# Patient Record
Sex: Male | Born: 1940 | Race: White | Hispanic: No | Smoking: Former smoker
Health system: Southern US, Community
[De-identification: ages and names within clinical notes are randomized; demographics above are authoritative.]

## PROBLEM LIST (undated history)

## (undated) DIAGNOSIS — I10 Essential (primary) hypertension: Secondary | ICD-10-CM

## (undated) HISTORY — PX: TREATMENT FISTULA ANAL: SUR1390

---

## 2003-09-05 DIAGNOSIS — I82409 Acute embolism and thrombosis of unspecified deep veins of unspecified lower extremity: Secondary | ICD-10-CM

## 2003-09-05 HISTORY — DX: Acute embolism and thrombosis of unspecified deep veins of unspecified lower extremity: I82.409

## 2005-10-02 ENCOUNTER — Ambulatory Visit: Payer: Self-pay | Admitting: Surgery

## 2006-03-01 ENCOUNTER — Inpatient Hospital Stay: Payer: Self-pay | Admitting: Internal Medicine

## 2006-03-01 ENCOUNTER — Other Ambulatory Visit: Payer: Self-pay

## 2006-10-02 ENCOUNTER — Ambulatory Visit: Payer: Self-pay | Admitting: Internal Medicine

## 2010-09-04 DIAGNOSIS — C439 Malignant melanoma of skin, unspecified: Secondary | ICD-10-CM

## 2010-09-04 HISTORY — DX: Malignant melanoma of skin, unspecified: C43.9

## 2011-10-02 ENCOUNTER — Ambulatory Visit: Payer: Self-pay | Admitting: Gastroenterology

## 2011-10-03 LAB — PATHOLOGY REPORT

## 2014-05-07 ENCOUNTER — Ambulatory Visit: Payer: Self-pay | Admitting: Internal Medicine

## 2014-10-23 DIAGNOSIS — Z7982 Long term (current) use of aspirin: Secondary | ICD-10-CM | POA: Diagnosis not present

## 2014-10-23 DIAGNOSIS — G43009 Migraine without aura, not intractable, without status migrainosus: Secondary | ICD-10-CM | POA: Diagnosis not present

## 2014-10-23 DIAGNOSIS — I809 Phlebitis and thrombophlebitis of unspecified site: Secondary | ICD-10-CM | POA: Diagnosis not present

## 2014-10-23 DIAGNOSIS — I10 Essential (primary) hypertension: Secondary | ICD-10-CM | POA: Diagnosis not present

## 2014-10-30 DIAGNOSIS — I10 Essential (primary) hypertension: Secondary | ICD-10-CM | POA: Diagnosis not present

## 2014-10-30 DIAGNOSIS — G43009 Migraine without aura, not intractable, without status migrainosus: Secondary | ICD-10-CM | POA: Diagnosis not present

## 2014-10-30 DIAGNOSIS — I809 Phlebitis and thrombophlebitis of unspecified site: Secondary | ICD-10-CM | POA: Diagnosis not present

## 2015-01-20 DIAGNOSIS — D229 Melanocytic nevi, unspecified: Secondary | ICD-10-CM | POA: Diagnosis not present

## 2015-01-20 DIAGNOSIS — Z85820 Personal history of malignant melanoma of skin: Secondary | ICD-10-CM | POA: Diagnosis not present

## 2015-01-20 DIAGNOSIS — L820 Inflamed seborrheic keratosis: Secondary | ICD-10-CM | POA: Diagnosis not present

## 2015-01-20 DIAGNOSIS — L821 Other seborrheic keratosis: Secondary | ICD-10-CM | POA: Diagnosis not present

## 2015-01-20 DIAGNOSIS — D1800 Hemangioma unspecified site: Secondary | ICD-10-CM | POA: Diagnosis not present

## 2015-01-20 DIAGNOSIS — L720 Epidermal cyst: Secondary | ICD-10-CM | POA: Diagnosis not present

## 2015-01-20 DIAGNOSIS — L570 Actinic keratosis: Secondary | ICD-10-CM | POA: Diagnosis not present

## 2015-01-20 DIAGNOSIS — Z1283 Encounter for screening for malignant neoplasm of skin: Secondary | ICD-10-CM | POA: Diagnosis not present

## 2015-04-23 DIAGNOSIS — I10 Essential (primary) hypertension: Secondary | ICD-10-CM | POA: Diagnosis not present

## 2015-04-23 DIAGNOSIS — I809 Phlebitis and thrombophlebitis of unspecified site: Secondary | ICD-10-CM | POA: Diagnosis not present

## 2015-04-30 DIAGNOSIS — I809 Phlebitis and thrombophlebitis of unspecified site: Secondary | ICD-10-CM | POA: Diagnosis not present

## 2015-04-30 DIAGNOSIS — Z125 Encounter for screening for malignant neoplasm of prostate: Secondary | ICD-10-CM | POA: Diagnosis not present

## 2015-04-30 DIAGNOSIS — Z7982 Long term (current) use of aspirin: Secondary | ICD-10-CM | POA: Diagnosis not present

## 2015-04-30 DIAGNOSIS — I10 Essential (primary) hypertension: Secondary | ICD-10-CM | POA: Diagnosis not present

## 2015-10-26 DIAGNOSIS — Z7982 Long term (current) use of aspirin: Secondary | ICD-10-CM | POA: Diagnosis not present

## 2015-10-26 DIAGNOSIS — I10 Essential (primary) hypertension: Secondary | ICD-10-CM | POA: Diagnosis not present

## 2015-10-26 DIAGNOSIS — Z125 Encounter for screening for malignant neoplasm of prostate: Secondary | ICD-10-CM | POA: Diagnosis not present

## 2015-11-02 DIAGNOSIS — I10 Essential (primary) hypertension: Secondary | ICD-10-CM | POA: Diagnosis not present

## 2015-11-02 DIAGNOSIS — S41111S Laceration without foreign body of right upper arm, sequela: Secondary | ICD-10-CM | POA: Diagnosis not present

## 2015-11-02 DIAGNOSIS — G43009 Migraine without aura, not intractable, without status migrainosus: Secondary | ICD-10-CM | POA: Diagnosis not present

## 2015-11-02 DIAGNOSIS — Z23 Encounter for immunization: Secondary | ICD-10-CM | POA: Diagnosis not present

## 2015-11-02 DIAGNOSIS — Z7982 Long term (current) use of aspirin: Secondary | ICD-10-CM | POA: Diagnosis not present

## 2015-11-02 DIAGNOSIS — Z0000 Encounter for general adult medical examination without abnormal findings: Secondary | ICD-10-CM | POA: Diagnosis not present

## 2016-01-19 DIAGNOSIS — L578 Other skin changes due to chronic exposure to nonionizing radiation: Secondary | ICD-10-CM | POA: Diagnosis not present

## 2016-01-19 DIAGNOSIS — Z85820 Personal history of malignant melanoma of skin: Secondary | ICD-10-CM | POA: Diagnosis not present

## 2016-01-19 DIAGNOSIS — D485 Neoplasm of uncertain behavior of skin: Secondary | ICD-10-CM | POA: Diagnosis not present

## 2016-01-19 DIAGNOSIS — L812 Freckles: Secondary | ICD-10-CM | POA: Diagnosis not present

## 2016-01-19 DIAGNOSIS — L821 Other seborrheic keratosis: Secondary | ICD-10-CM | POA: Diagnosis not present

## 2016-01-19 DIAGNOSIS — C4491 Basal cell carcinoma of skin, unspecified: Secondary | ICD-10-CM

## 2016-01-19 DIAGNOSIS — D229 Melanocytic nevi, unspecified: Secondary | ICD-10-CM | POA: Diagnosis not present

## 2016-01-19 DIAGNOSIS — L570 Actinic keratosis: Secondary | ICD-10-CM | POA: Diagnosis not present

## 2016-01-19 DIAGNOSIS — Z1283 Encounter for screening for malignant neoplasm of skin: Secondary | ICD-10-CM | POA: Diagnosis not present

## 2016-01-19 DIAGNOSIS — C44311 Basal cell carcinoma of skin of nose: Secondary | ICD-10-CM | POA: Diagnosis not present

## 2016-01-19 HISTORY — DX: Basal cell carcinoma of skin, unspecified: C44.91

## 2016-04-20 DIAGNOSIS — L578 Other skin changes due to chronic exposure to nonionizing radiation: Secondary | ICD-10-CM | POA: Diagnosis not present

## 2016-04-20 DIAGNOSIS — L570 Actinic keratosis: Secondary | ICD-10-CM | POA: Diagnosis not present

## 2016-04-20 DIAGNOSIS — D229 Melanocytic nevi, unspecified: Secondary | ICD-10-CM | POA: Diagnosis not present

## 2016-04-20 DIAGNOSIS — Z1283 Encounter for screening for malignant neoplasm of skin: Secondary | ICD-10-CM | POA: Diagnosis not present

## 2016-04-20 DIAGNOSIS — Z85820 Personal history of malignant melanoma of skin: Secondary | ICD-10-CM | POA: Diagnosis not present

## 2016-04-20 DIAGNOSIS — Z85828 Personal history of other malignant neoplasm of skin: Secondary | ICD-10-CM | POA: Diagnosis not present

## 2016-04-20 DIAGNOSIS — L821 Other seborrheic keratosis: Secondary | ICD-10-CM | POA: Diagnosis not present

## 2016-04-20 DIAGNOSIS — B078 Other viral warts: Secondary | ICD-10-CM | POA: Diagnosis not present

## 2016-04-20 DIAGNOSIS — D1800 Hemangioma unspecified site: Secondary | ICD-10-CM | POA: Diagnosis not present

## 2016-04-25 DIAGNOSIS — Z7982 Long term (current) use of aspirin: Secondary | ICD-10-CM | POA: Diagnosis not present

## 2016-04-25 DIAGNOSIS — I10 Essential (primary) hypertension: Secondary | ICD-10-CM | POA: Diagnosis not present

## 2016-05-02 DIAGNOSIS — G43009 Migraine without aura, not intractable, without status migrainosus: Secondary | ICD-10-CM | POA: Diagnosis not present

## 2016-05-02 DIAGNOSIS — Z7982 Long term (current) use of aspirin: Secondary | ICD-10-CM | POA: Diagnosis not present

## 2016-05-02 DIAGNOSIS — I10 Essential (primary) hypertension: Secondary | ICD-10-CM | POA: Diagnosis not present

## 2016-05-02 DIAGNOSIS — Z125 Encounter for screening for malignant neoplasm of prostate: Secondary | ICD-10-CM | POA: Diagnosis not present

## 2016-10-23 DIAGNOSIS — L812 Freckles: Secondary | ICD-10-CM | POA: Diagnosis not present

## 2016-10-23 DIAGNOSIS — D1800 Hemangioma unspecified site: Secondary | ICD-10-CM | POA: Diagnosis not present

## 2016-10-23 DIAGNOSIS — L820 Inflamed seborrheic keratosis: Secondary | ICD-10-CM | POA: Diagnosis not present

## 2016-10-23 DIAGNOSIS — L578 Other skin changes due to chronic exposure to nonionizing radiation: Secondary | ICD-10-CM | POA: Diagnosis not present

## 2016-10-23 DIAGNOSIS — Z85828 Personal history of other malignant neoplasm of skin: Secondary | ICD-10-CM | POA: Diagnosis not present

## 2016-10-23 DIAGNOSIS — D229 Melanocytic nevi, unspecified: Secondary | ICD-10-CM | POA: Diagnosis not present

## 2016-10-23 DIAGNOSIS — L570 Actinic keratosis: Secondary | ICD-10-CM | POA: Diagnosis not present

## 2016-10-23 DIAGNOSIS — Z1283 Encounter for screening for malignant neoplasm of skin: Secondary | ICD-10-CM | POA: Diagnosis not present

## 2016-10-23 DIAGNOSIS — Z85820 Personal history of malignant melanoma of skin: Secondary | ICD-10-CM | POA: Diagnosis not present

## 2016-10-27 DIAGNOSIS — Z125 Encounter for screening for malignant neoplasm of prostate: Secondary | ICD-10-CM | POA: Diagnosis not present

## 2016-10-27 DIAGNOSIS — I10 Essential (primary) hypertension: Secondary | ICD-10-CM | POA: Diagnosis not present

## 2016-10-27 DIAGNOSIS — Z7982 Long term (current) use of aspirin: Secondary | ICD-10-CM | POA: Diagnosis not present

## 2016-11-03 DIAGNOSIS — I10 Essential (primary) hypertension: Secondary | ICD-10-CM | POA: Diagnosis not present

## 2016-11-03 DIAGNOSIS — Z0000 Encounter for general adult medical examination without abnormal findings: Secondary | ICD-10-CM | POA: Diagnosis not present

## 2016-11-03 DIAGNOSIS — Z7982 Long term (current) use of aspirin: Secondary | ICD-10-CM | POA: Diagnosis not present

## 2016-11-03 DIAGNOSIS — Z23 Encounter for immunization: Secondary | ICD-10-CM | POA: Diagnosis not present

## 2017-04-23 DIAGNOSIS — Z85828 Personal history of other malignant neoplasm of skin: Secondary | ICD-10-CM | POA: Diagnosis not present

## 2017-04-23 DIAGNOSIS — L578 Other skin changes due to chronic exposure to nonionizing radiation: Secondary | ICD-10-CM | POA: Diagnosis not present

## 2017-04-23 DIAGNOSIS — L821 Other seborrheic keratosis: Secondary | ICD-10-CM | POA: Diagnosis not present

## 2017-04-23 DIAGNOSIS — L570 Actinic keratosis: Secondary | ICD-10-CM | POA: Diagnosis not present

## 2017-04-23 DIAGNOSIS — D1800 Hemangioma unspecified site: Secondary | ICD-10-CM | POA: Diagnosis not present

## 2017-04-23 DIAGNOSIS — D229 Melanocytic nevi, unspecified: Secondary | ICD-10-CM | POA: Diagnosis not present

## 2017-04-23 DIAGNOSIS — L812 Freckles: Secondary | ICD-10-CM | POA: Diagnosis not present

## 2017-04-23 DIAGNOSIS — Z85820 Personal history of malignant melanoma of skin: Secondary | ICD-10-CM | POA: Diagnosis not present

## 2017-05-01 DIAGNOSIS — Z7982 Long term (current) use of aspirin: Secondary | ICD-10-CM | POA: Diagnosis not present

## 2017-05-01 DIAGNOSIS — I10 Essential (primary) hypertension: Secondary | ICD-10-CM | POA: Diagnosis not present

## 2017-05-10 DIAGNOSIS — I10 Essential (primary) hypertension: Secondary | ICD-10-CM | POA: Diagnosis not present

## 2017-05-10 DIAGNOSIS — Z7982 Long term (current) use of aspirin: Secondary | ICD-10-CM | POA: Diagnosis not present

## 2017-05-10 DIAGNOSIS — Z125 Encounter for screening for malignant neoplasm of prostate: Secondary | ICD-10-CM | POA: Diagnosis not present

## 2017-05-10 DIAGNOSIS — G43909 Migraine, unspecified, not intractable, without status migrainosus: Secondary | ICD-10-CM | POA: Diagnosis not present

## 2017-10-24 DIAGNOSIS — L820 Inflamed seborrheic keratosis: Secondary | ICD-10-CM | POA: Diagnosis not present

## 2017-10-24 DIAGNOSIS — Z1283 Encounter for screening for malignant neoplasm of skin: Secondary | ICD-10-CM | POA: Diagnosis not present

## 2017-10-24 DIAGNOSIS — L570 Actinic keratosis: Secondary | ICD-10-CM | POA: Diagnosis not present

## 2017-10-24 DIAGNOSIS — D1800 Hemangioma unspecified site: Secondary | ICD-10-CM | POA: Diagnosis not present

## 2017-10-24 DIAGNOSIS — Z85828 Personal history of other malignant neoplasm of skin: Secondary | ICD-10-CM | POA: Diagnosis not present

## 2017-10-24 DIAGNOSIS — L812 Freckles: Secondary | ICD-10-CM | POA: Diagnosis not present

## 2017-10-24 DIAGNOSIS — D229 Melanocytic nevi, unspecified: Secondary | ICD-10-CM | POA: Diagnosis not present

## 2017-10-24 DIAGNOSIS — L578 Other skin changes due to chronic exposure to nonionizing radiation: Secondary | ICD-10-CM | POA: Diagnosis not present

## 2017-10-24 DIAGNOSIS — Z85820 Personal history of malignant melanoma of skin: Secondary | ICD-10-CM | POA: Diagnosis not present

## 2017-11-05 DIAGNOSIS — Z125 Encounter for screening for malignant neoplasm of prostate: Secondary | ICD-10-CM | POA: Diagnosis not present

## 2017-11-05 DIAGNOSIS — Z7982 Long term (current) use of aspirin: Secondary | ICD-10-CM | POA: Diagnosis not present

## 2017-11-05 DIAGNOSIS — I10 Essential (primary) hypertension: Secondary | ICD-10-CM | POA: Diagnosis not present

## 2017-11-12 DIAGNOSIS — G43909 Migraine, unspecified, not intractable, without status migrainosus: Secondary | ICD-10-CM | POA: Diagnosis not present

## 2017-11-12 DIAGNOSIS — I10 Essential (primary) hypertension: Secondary | ICD-10-CM | POA: Diagnosis not present

## 2017-11-12 DIAGNOSIS — R3129 Other microscopic hematuria: Secondary | ICD-10-CM | POA: Diagnosis not present

## 2017-11-12 DIAGNOSIS — Z0000 Encounter for general adult medical examination without abnormal findings: Secondary | ICD-10-CM | POA: Diagnosis not present

## 2018-04-24 DIAGNOSIS — Z85820 Personal history of malignant melanoma of skin: Secondary | ICD-10-CM | POA: Diagnosis not present

## 2018-04-24 DIAGNOSIS — L578 Other skin changes due to chronic exposure to nonionizing radiation: Secondary | ICD-10-CM | POA: Diagnosis not present

## 2018-04-24 DIAGNOSIS — L821 Other seborrheic keratosis: Secondary | ICD-10-CM | POA: Diagnosis not present

## 2018-04-24 DIAGNOSIS — D1801 Hemangioma of skin and subcutaneous tissue: Secondary | ICD-10-CM | POA: Diagnosis not present

## 2018-04-24 DIAGNOSIS — Z85828 Personal history of other malignant neoplasm of skin: Secondary | ICD-10-CM | POA: Diagnosis not present

## 2018-04-24 DIAGNOSIS — Z1283 Encounter for screening for malignant neoplasm of skin: Secondary | ICD-10-CM | POA: Diagnosis not present

## 2018-04-24 DIAGNOSIS — L812 Freckles: Secondary | ICD-10-CM | POA: Diagnosis not present

## 2018-04-25 ENCOUNTER — Encounter: Payer: Self-pay | Admitting: *Deleted

## 2018-05-09 DIAGNOSIS — R3129 Other microscopic hematuria: Secondary | ICD-10-CM | POA: Diagnosis not present

## 2018-05-09 DIAGNOSIS — I10 Essential (primary) hypertension: Secondary | ICD-10-CM | POA: Diagnosis not present

## 2018-05-14 DIAGNOSIS — L720 Epidermal cyst: Secondary | ICD-10-CM | POA: Diagnosis not present

## 2018-05-16 DIAGNOSIS — G43909 Migraine, unspecified, not intractable, without status migrainosus: Secondary | ICD-10-CM | POA: Diagnosis not present

## 2018-05-16 DIAGNOSIS — I10 Essential (primary) hypertension: Secondary | ICD-10-CM | POA: Diagnosis not present

## 2018-05-16 DIAGNOSIS — Z125 Encounter for screening for malignant neoplasm of prostate: Secondary | ICD-10-CM | POA: Diagnosis not present

## 2018-05-23 ENCOUNTER — Ambulatory Visit: Payer: Self-pay | Admitting: General Surgery

## 2018-10-28 ENCOUNTER — Other Ambulatory Visit: Payer: Self-pay | Admitting: Internal Medicine

## 2018-10-28 DIAGNOSIS — M5442 Lumbago with sciatica, left side: Secondary | ICD-10-CM

## 2018-10-29 ENCOUNTER — Ambulatory Visit
Admission: RE | Admit: 2018-10-29 | Discharge: 2018-10-29 | Disposition: A | Discharge status: Home / Self Care | Payer: Medicare Other | Source: Ambulatory Visit | Attending: Internal Medicine | Admitting: Internal Medicine

## 2018-10-29 DIAGNOSIS — M5442 Lumbago with sciatica, left side: Secondary | ICD-10-CM | POA: Diagnosis not present

## 2019-10-06 DIAGNOSIS — U071 COVID-19: Secondary | ICD-10-CM

## 2019-10-06 HISTORY — DX: COVID-19: U07.1

## 2020-11-01 ENCOUNTER — Ambulatory Visit: Payer: Medicare Other | Admitting: Dermatology

## 2020-11-01 ENCOUNTER — Other Ambulatory Visit: Payer: Self-pay

## 2020-11-01 DIAGNOSIS — Z85828 Personal history of other malignant neoplasm of skin: Secondary | ICD-10-CM

## 2020-11-01 DIAGNOSIS — L853 Xerosis cutis: Secondary | ICD-10-CM

## 2020-11-01 DIAGNOSIS — L814 Other melanin hyperpigmentation: Secondary | ICD-10-CM

## 2020-11-01 DIAGNOSIS — L821 Other seborrheic keratosis: Secondary | ICD-10-CM

## 2020-11-01 DIAGNOSIS — L570 Actinic keratosis: Secondary | ICD-10-CM

## 2020-11-01 DIAGNOSIS — L720 Epidermal cyst: Secondary | ICD-10-CM

## 2020-11-01 DIAGNOSIS — L578 Other skin changes due to chronic exposure to nonionizing radiation: Secondary | ICD-10-CM | POA: Diagnosis not present

## 2020-11-01 DIAGNOSIS — Z1283 Encounter for screening for malignant neoplasm of skin: Secondary | ICD-10-CM

## 2020-11-01 DIAGNOSIS — D692 Other nonthrombocytopenic purpura: Secondary | ICD-10-CM

## 2020-11-01 DIAGNOSIS — L820 Inflamed seborrheic keratosis: Secondary | ICD-10-CM | POA: Diagnosis not present

## 2020-11-01 DIAGNOSIS — D1800 Hemangioma unspecified site: Secondary | ICD-10-CM

## 2020-11-01 DIAGNOSIS — D229 Melanocytic nevi, unspecified: Secondary | ICD-10-CM

## 2020-11-01 NOTE — Patient Instructions (Addendum)
Melanoma ABCDEs  Melanoma is the most dangerous type of skin cancer, and is the leading cause of death from skin disease.  You are more likely to develop melanoma if you:  Have light-colored skin, light-colored eyes, or red or blond hair  Spend a lot of time in the sun  Tan regularly, either outdoors or in a tanning bed  Have had blistering sunburns, especially during childhood  Have a close family member who has had a melanoma  Have atypical moles or large birthmarks  Early detection of melanoma is key since treatment is typically straightforward and cure rates are extremely high if we catch it early.   The first sign of melanoma is often a change in a mole or a new dark spot.  The ABCDE system is a way of remembering the signs of melanoma.  A for asymmetry:  The two halves do not match. B for border:  The edges of the growth are irregular. C for color:  A mixture of colors are present instead of an even brown color. D for diameter:  Melanomas are usually (but not always) greater than 58mm - the size of a pencil eraser. E for evolution:  The spot keeps changing in size, shape, and color.  Please check your skin once per month between visits. You can use a small mirror in front and a large mirror behind you to keep an eye on the back side or your body.   If you see any new or changing lesions before your next follow-up, please call to schedule a visit.  Please continue daily skin protection including broad spectrum sunscreen SPF 30+ to sun-exposed areas, reapplying every 2 hours as needed when you're outdoors.    Cryotherapy Aftercare  . Wash gently with soap and water everyday.   Marland Kitchen Apply Vaseline and Band-Aid daily until healed.   Gentle Skin Care Guide  1. Bathe no more than once a day.  2. Avoid bathing in hot water  3. Use a mild soap like Dove, Vanicream, Cetaphil, CeraVe. Can use Lever 2000 or Cetaphil antibacterial soap  4. Use soap only where you need it. On most  days, use it under your arms, between your legs, and on your feet. Let the water rinse other areas unless visibly dirty.  5. When you get out of the bath/shower, use a towel to gently blot your skin dry, don't rub it.  6. While your skin is still a little damp, apply a moisturizing cream such as Vanicream, CeraVe, Cetaphil, Eucerin, Sarna lotion or plain Vaseline Jelly. For hands apply Neutrogena [place] Hand Cream or Excipial Hand Cream.  7. Reapply moisturizer any time you start to itch or feel dry.  8. Sometimes using free and clear laundry detergents can be helpful. Fabric softener sheets should be avoided. Downy Free & Gentle liquid, or any liquid fabric softener that is free of dyes and perfumes, it acceptable to use  9. If your doctor has given you prescription creams you may apply moisturizers over them

## 2020-11-01 NOTE — Progress Notes (Signed)
Follow-Up Visit   Subjective  Johnny Patterson is a 80 y.o. male who presents for the following: TBSE (Patient here for full body skin exam and skin cancer screening. 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 ). The patient presents for Total-Body Skin Exam (TBSE) for skin cancer screening and mole check.  The following portions of the chart were reviewed this encounter and updated as appropriate:   Allergies  Meds  Problems  Med Hx  Surg Hx  Fam Hx     Review of Systems:  No other skin or systemic complaints except as noted in HPI or Assessment and Plan.  Objective  Well appearing patient in no apparent distress; mood and affect are within normal limits.  A full examination was performed including scalp, head, eyes, ears, nose, lips, neck, chest, axillae, abdomen, back, buttocks, bilateral upper extremities, bilateral lower extremities, hands, feet, fingers, toes, fingernails, and toenails. All findings within normal limits unless otherwise noted below.  Objective  Right cheek x 1: Erythematous thin papules/macules with gritty scale.   Objective  Right Hand x 1, right forearm x 1: Erythematous keratotic or waxy stuck-on papule or plaque.   Objective  Right Upper Back: Subcutaneous nodule.    Assessment & Plan  AK (actinic keratosis) Right cheek x 1  Destruction of lesion - Right cheek x 1 Complexity: simple   Destruction method: cryotherapy   Informed consent: discussed and consent obtained   Timeout:  patient name, date of birth, surgical site, and procedure verified Lesion destroyed using liquid nitrogen: Yes   Region frozen until ice ball extended beyond lesion: Yes   Outcome: patient tolerated procedure well with no complications   Post-procedure details: wound care instructions given    Inflamed seborrheic keratosis Right Hand x 1, right forearm x 1  Destruction of lesion - Right Hand x 1, right forearm x 1 Complexity: simple   Destruction method:  cryotherapy   Informed consent: discussed and consent obtained   Timeout:  patient name, date of birth, surgical site, and procedure verified Lesion destroyed using liquid nitrogen: Yes   Region frozen until ice ball extended beyond lesion: Yes   Outcome: patient tolerated procedure well with no complications   Post-procedure details: wound care instructions given    Epidermal inclusion cyst Right Upper Back  Benign-appearing.  Observation.  Call clinic for new or changing lesions.  Recommend daily use of broad spectrum spf 30+ sunscreen to sun-exposed areas.     History of Basal Cell Carcinoma of the Skin - No evidence of recurrence today at right superior nasal ala - Recommend regular full body skin exams - Recommend daily broad spectrum sunscreen SPF 30+ to sun-exposed areas, reapply every 2 hours as needed.  - Call if any new or changing lesions are noted between office visits  Lentigines - Scattered tan macules - Due to sun exposure - Benign-appering, observe - Recommend daily broad spectrum sunscreen SPF 30+ to sun-exposed areas, reapply every 2 hours as needed. - Call for any changes  Seborrheic Keratoses - Stuck-on, waxy, tan-brown papules and plaques  - Discussed benign etiology and prognosis. - Observe - Call for any changes  Melanocytic Nevi - Tan-brown and/or pink-flesh-colored symmetric macules and papules - Benign appearing on exam today - Observation - Call clinic for new or changing moles - Recommend daily use of broad spectrum spf 30+ sunscreen to sun-exposed areas.   Hemangiomas - Red papules - Discussed benign nature - Observe - Call  for any changes  Actinic Damage - Chronic, secondary to cumulative UV/sun exposure - diffuse scaly erythematous macules with underlying dyspigmentation - Recommend daily broad spectrum sunscreen SPF 30+ to sun-exposed areas, reapply every 2 hours as needed.  - Call for new or changing lesions.  Skin cancer screening  performed today.  Purpura - Chronic; persistent and recurrent.  Treatable, but not curable. - Violaceous macules and patches - Benign - Related to trauma, age, sun damage and/or use of blood thinners, chronic use of topical and/or oral steroids - Observe - Can use OTC arnica containing moisturizer such as Dermend Bruise Formula if desired - Call for worsening or other concerns  Xerosis - diffuse xerotic patches - recommend gentle, hydrating skin care - gentle skin care handout given   Return in about 1 year (around 11/01/2021) for TBSE.  Graciella Belton, RMA, am acting as scribe for Sarina Ser, MD . Documentation: I have reviewed the above documentation for accuracy and completeness, and I agree with the above.  Sarina Ser, MD

## 2020-11-02 ENCOUNTER — Encounter: Payer: Self-pay | Admitting: Dermatology

## 2020-11-30 ENCOUNTER — Other Ambulatory Visit: Payer: Self-pay

## 2020-11-30 ENCOUNTER — Encounter: Payer: Self-pay | Admitting: Ophthalmology

## 2020-12-02 ENCOUNTER — Other Ambulatory Visit: Payer: Self-pay

## 2020-12-02 ENCOUNTER — Other Ambulatory Visit
Admission: RE | Admit: 2020-12-02 | Discharge: 2020-12-02 | Disposition: A | Discharge status: Home / Self Care | Payer: Medicare Other | Source: Ambulatory Visit | Attending: Ophthalmology | Admitting: Ophthalmology

## 2020-12-02 DIAGNOSIS — Z01812 Encounter for preprocedural laboratory examination: Secondary | ICD-10-CM | POA: Diagnosis present

## 2020-12-02 DIAGNOSIS — Z20822 Contact with and (suspected) exposure to covid-19: Secondary | ICD-10-CM | POA: Diagnosis not present

## 2020-12-02 LAB — SARS CORONAVIRUS 2 (TAT 6-24 HRS): SARS Coronavirus 2: NEGATIVE

## 2020-12-02 NOTE — Discharge Instructions (Signed)

## 2020-12-06 ENCOUNTER — Encounter
Admission: RE | Disposition: A | Discharge status: Home / Self Care | Payer: Self-pay | Source: Home / Self Care | Attending: Ophthalmology

## 2020-12-06 ENCOUNTER — Ambulatory Visit: Payer: Medicare Other | Admitting: Anesthesiology

## 2020-12-06 ENCOUNTER — Encounter: Payer: Self-pay | Admitting: Ophthalmology

## 2020-12-06 ENCOUNTER — Ambulatory Visit
Admission: RE | Admit: 2020-12-06 | Discharge: 2020-12-06 | Disposition: A | Discharge status: Home / Self Care | Payer: Medicare Other | Attending: Ophthalmology | Admitting: Ophthalmology

## 2020-12-06 ENCOUNTER — Other Ambulatory Visit: Payer: Self-pay

## 2020-12-06 DIAGNOSIS — Z79899 Other long term (current) drug therapy: Secondary | ICD-10-CM | POA: Diagnosis not present

## 2020-12-06 DIAGNOSIS — Z87891 Personal history of nicotine dependence: Secondary | ICD-10-CM | POA: Insufficient documentation

## 2020-12-06 DIAGNOSIS — I10 Essential (primary) hypertension: Secondary | ICD-10-CM | POA: Diagnosis not present

## 2020-12-06 DIAGNOSIS — Z7982 Long term (current) use of aspirin: Secondary | ICD-10-CM | POA: Insufficient documentation

## 2020-12-06 DIAGNOSIS — Z8616 Personal history of COVID-19: Secondary | ICD-10-CM | POA: Insufficient documentation

## 2020-12-06 DIAGNOSIS — H2512 Age-related nuclear cataract, left eye: Secondary | ICD-10-CM | POA: Insufficient documentation

## 2020-12-06 DIAGNOSIS — Z86718 Personal history of other venous thrombosis and embolism: Secondary | ICD-10-CM | POA: Insufficient documentation

## 2020-12-06 DIAGNOSIS — Z85828 Personal history of other malignant neoplasm of skin: Secondary | ICD-10-CM | POA: Insufficient documentation

## 2020-12-06 HISTORY — PX: CATARACT EXTRACTION W/PHACO: SHX586

## 2020-12-06 HISTORY — DX: Essential (primary) hypertension: I10

## 2020-12-06 SURGERY — PHACOEMULSIFICATION, CATARACT, WITH IOL INSERTION
Anesthesia: Monitor Anesthesia Care | Site: Eye | Laterality: Left

## 2020-12-06 MED ORDER — TETRACAINE HCL 0.5 % OP SOLN
1.0000 [drp] | OPHTHALMIC | Status: DC | PRN
  Administered 2020-12-06 (×3): 1 [drp] via OPHTHALMIC

## 2020-12-06 MED ORDER — EPINEPHRINE PF 1 MG/ML IJ SOLN
INTRAOCULAR | Status: DC | PRN
  Administered 2020-12-06: 28 mL via OPHTHALMIC

## 2020-12-06 MED ORDER — SODIUM HYALURONATE 23 MG/ML IO SOLN
INTRAOCULAR | Status: DC | PRN
  Administered 2020-12-06: 0.6 mL via INTRAOCULAR

## 2020-12-06 MED ORDER — LIDOCAINE HCL (PF) 2 % IJ SOLN
INTRAOCULAR | Status: DC | PRN
  Administered 2020-12-06: 1 mL via INTRAOCULAR

## 2020-12-06 MED ORDER — MIDAZOLAM HCL 2 MG/2ML IJ SOLN
INTRAMUSCULAR | Status: DC | PRN
  Administered 2020-12-06: 2 mg via INTRAVENOUS

## 2020-12-06 MED ORDER — MOXIFLOXACIN HCL 0.5 % OP SOLN
OPHTHALMIC | Status: DC | PRN
  Administered 2020-12-06: 0.2 mL via OPHTHALMIC

## 2020-12-06 MED ORDER — FENTANYL CITRATE (PF) 100 MCG/2ML IJ SOLN
INTRAMUSCULAR | Status: DC | PRN
  Administered 2020-12-06: 50 ug via INTRAVENOUS

## 2020-12-06 MED ORDER — SODIUM HYALURONATE 10 MG/ML IO SOLN
INTRAOCULAR | Status: DC | PRN
  Administered 2020-12-06: 0.55 mL via INTRAOCULAR

## 2020-12-06 MED ORDER — ARMC OPHTHALMIC DILATING DROPS
1.0000 "application " | OPHTHALMIC | Status: DC | PRN
  Administered 2020-12-06 (×3): 1 via OPHTHALMIC

## 2020-12-06 SURGICAL SUPPLY — 17 items
CANNULA ANT/CHMB 27GA (MISCELLANEOUS) ×4 IMPLANT
DISSECTOR HYDRO NUCLEUS 50X22 (MISCELLANEOUS) ×2 IMPLANT
GLOVE PI ULTRA LF STRL 7.5 (GLOVE) ×1 IMPLANT
GLOVE PI ULTRA NON LATEX 7.5 (GLOVE) ×1
GLOVE SURG SYN 8.5  E (GLOVE) ×1
GLOVE SURG SYN 8.5 E (GLOVE) ×1 IMPLANT
GOWN STRL REUS W/ TWL LRG LVL3 (GOWN DISPOSABLE) ×2 IMPLANT
GOWN STRL REUS W/TWL LRG LVL3 (GOWN DISPOSABLE) ×4
LENS IOL TECNIS EYHANCE 18.0 (Intraocular Lens) ×2 IMPLANT
MARKER SKIN DUAL TIP RULER LAB (MISCELLANEOUS) ×2 IMPLANT
PACK DR. KING ARMS (PACKS) ×2 IMPLANT
PACK EYE AFTER SURG (MISCELLANEOUS) ×2 IMPLANT
PACK OPTHALMIC (MISCELLANEOUS) ×2 IMPLANT
SYR 3ML LL SCALE MARK (SYRINGE) ×2 IMPLANT
SYR TB 1ML LUER SLIP (SYRINGE) ×2 IMPLANT
WATER STERILE IRR 250ML POUR (IV SOLUTION) ×2 IMPLANT
WIPE NON LINTING 3.25X3.25 (MISCELLANEOUS) ×2 IMPLANT

## 2020-12-06 NOTE — Anesthesia Postprocedure Evaluation (Signed)
Anesthesia Post Note  Patient: Johnny Patterson  Procedure(s) Performed: CATARACT EXTRACTION PHACO AND INTRAOCULAR LENS PLACEMENT (IOC) LEFT 11.70 01:00.3 (Left Eye)     Patient location during evaluation: PACU Anesthesia Type: MAC Level of consciousness: awake and alert Pain management: pain level controlled Vital Signs Assessment: post-procedure vital signs reviewed and stable Respiratory status: spontaneous breathing, nonlabored ventilation, respiratory function stable and patient connected to nasal cannula oxygen Cardiovascular status: stable and blood pressure returned to baseline Postop Assessment: no apparent nausea or vomiting Anesthetic complications: no   No complications documented.  Adele Barthel Cassandra Harbold

## 2020-12-06 NOTE — Op Note (Signed)
OPERATIVE NOTE  Johnny Patterson [id number] 12/06/2020   PREOPERATIVE DIAGNOSIS:  Nuclear sclerotic cataract left eye.  H25.12   POSTOPERATIVE DIAGNOSIS:    Nuclear sclerotic cataract left eye.     PROCEDURE:  Phacoemusification with posterior chamber intraocular lens placement of the left eye   LENS:   Implant Name Type Inv. Item Serial No. Manufacturer Lot No. LRB No. Used Action  LENS IOL TECNIS EYHANCE 18.0 - [id number] Intraocular Lens LENS IOL TECNIS EYHANCE 18.0 [id number] JOHNSON   Left 1 Implanted      Procedure(s): CATARACT EXTRACTION PHACO AND INTRAOCULAR LENS PLACEMENT (IOC) LEFT 11.70 01:00.3 (Left)  DIB00 +18.0   ULTRASOUND TIME: 1 minutes 0 seconds.  CDE 11.70   SURGEON:  Benay Pillow, MD, MPH   ANESTHESIA:  Topical with tetracaine drops augmented with 1% preservative-free intracameral lidocaine.  ESTIMATED BLOOD LOSS: <1 mL   COMPLICATIONS:  None.   DESCRIPTION OF PROCEDURE:  The patient was identified in the holding room and transported to the operating room and placed in the supine position under the operating microscope.  The left eye was identified as the operative eye and it was prepped and draped in the usual sterile ophthalmic fashion.   A 1.0 millimeter clear-corneal paracentesis was made at the 5:00 position. 0.5 ml of preservative-free 1% lidocaine with epinephrine was injected into the anterior chamber.  The anterior chamber was filled with Healon 5 viscoelastic.  A 2.4 millimeter keratome was used to make a near-clear corneal incision at the 2:00 position.  A curvilinear capsulorrhexis was made with a cystotome and capsulorrhexis forceps.  Balanced salt solution was used to hydrodissect and hydrodelineate the nucleus.   Phacoemulsification was then used in stop and chop fashion to remove the lens nucleus and epinucleus.  The remaining cortex was then removed using the irrigation and aspiration handpiece. Healon was then placed into the capsular bag to  distend it for lens placement.  A lens was then injected into the capsular bag.  The remaining viscoelastic was aspirated.   Wounds were hydrated with balanced salt solution.  The anterior chamber was inflated to a physiologic pressure with balanced salt solution.  Intracameral vigamox 0.1 mL undiltued was injected into the eye and a drop placed onto the ocular surface.  No wound leaks were noted.  The patient was taken to the recovery room in stable condition without complications of anesthesia or surgery  Benay Pillow 12/06/2020, 12:57 PM

## 2020-12-06 NOTE — Transfer of Care (Signed)
Immediate Anesthesia Transfer of Care Note  Patient: Johnny Patterson  Procedure(s) Performed: CATARACT EXTRACTION PHACO AND INTRAOCULAR LENS PLACEMENT (IOC) LEFT 11.70 01:00.3 (Left Eye)  Patient Location: PACU  Anesthesia Type: MAC  Level of Consciousness: awake, alert  and patient cooperative  Airway and Oxygen Therapy: Patient Spontanous Breathing and Patient connected to supplemental oxygen  Post-op Assessment: Post-op Vital signs reviewed, Patient's Cardiovascular Status Stable, Respiratory Function Stable, Patent Airway and No signs of Nausea or vomiting  Post-op Vital Signs: Reviewed and stable  Complications: No complications documented.

## 2020-12-06 NOTE — H&P (Signed)
[facility]   Primary Care Physician:  Adin Hector, MD Ophthalmologist: Dr. Benay Pillow  Pre-Procedure History & Physical: HPI:  Johnny Patterson is a 80 y.o. male here for cataract surgery.   Past Medical History:  Diagnosis Date  . Basal cell carcinoma 01/19/2016   R sup nasal ala   . COVID-19 10/2019  . DVT (deep venous thrombosis) ([place]) 2005   left.  Resolved.  . Hypertension     Past Surgical History:  Procedure Laterality Date  . TREATMENT FISTULA ANAL      Prior to Admission medications   Medication Sig Start Date End Date Taking? Authorizing Provider  aspirin 325 MG EC tablet Take by mouth.   Yes [provider]  lisinopril-hydrochlorothiazide (ZESTORETIC) 20-25 MG tablet Take 1 tablet by mouth daily. 12/01/19  Yes [provider]    Allergies as of 10/05/2020  . (Not on File)    History reviewed. No pertinent family history.  Social History   Socioeconomic History  . Marital status: Divorced    Spouse name: Not on file  . Number of children: Not on file  . Years of education: Not on file  . Highest education level: Not on file  Occupational History  . Not on file  Tobacco Use  . Smoking status: Former Smoker    Types: Cigarettes    Quit date: 1990    Years since quitting: 32.2  . Smokeless tobacco: Never Used  Vaping Use  . Vaping Use: Never used  Substance and Sexual Activity  . Alcohol use: Yes    Comment: rare  . Drug use: Not on file  . Sexual activity: Not on file  Other Topics Concern  . Not on file  Social History Narrative  . Not on file   Social Determinants of Health   Financial Resource Strain: Not on file  Food Insecurity: Not on file  Transportation Needs: Not on file  Physical Activity: Not on file  Stress: Not on file  Social Connections: Not on file  Intimate Partner Violence: Not on file    Review of Systems: See HPI, otherwise negative ROS  Physical Exam: Pulse 75   Temp 98.4 F  (36.9 C) (Temporal)   Ht 6' (1.829 m)   Wt 92.5 kg   SpO2 98%   BMI 27.67 kg/m  General:   Alert,  pleasant and cooperative in NAD Head:  Normocephalic and atraumatic. Respiratory:  Normal work of breathing. Cardiovascular:  RRR  Impression/Plan: Johnny Patterson is here for cataract surgery.  Risks, benefits, limitations, and alternatives regarding cataract surgery have been reviewed with the patient.  Questions have been answered.  All parties agreeable.   Benay Pillow, MD  12/06/2020, 12:25 PM

## 2020-12-06 NOTE — Anesthesia Preprocedure Evaluation (Signed)
Anesthesia Evaluation  Patient identified by MRN, date of birth, ID band Patient awake    History of Anesthesia Complications Negative for: history of anesthetic complications  Airway Mallampati: III  TM Distance: >3 FB Neck ROM: Full    Dental no notable dental hx.    Pulmonary former smoker,    Pulmonary exam normal        Cardiovascular Exercise Tolerance: Good hypertension, Pt. on medications Normal cardiovascular exam  Recent MD note mentions intermittent chest tightness with stress test ordered. In discussion with patient, he notes that he has had these symptoms for many many years and has had negative stress tests in the past. His only risk factor for CAD is HTN. He is able to walk up multiple flights of stairs and is regularly physically active.    Neuro/Psych negative neurological ROS     GI/Hepatic negative GI ROS, Neg liver ROS,   Endo/Other  negative endocrine ROS  Renal/GU negative Renal ROS     Musculoskeletal negative musculoskeletal ROS (+)   Abdominal   Peds  Hematology negative hematology ROS (+)   Anesthesia Other Findings   Reproductive/Obstetrics                             Anesthesia Physical Anesthesia Plan  ASA: II  Anesthesia Plan: MAC   Post-op Pain Management:    Induction: Intravenous  PONV Risk Score and Plan: 1 and Midazolam, TIVA and Treatment may vary due to age or medical condition  Airway Management Planned: Nasal Cannula and Natural Airway  Additional Equipment: None  Intra-op Plan:   Post-operative Plan:   Informed Consent: I have reviewed the patients History and Physical, chart, labs and discussed the procedure including the risks, benefits and alternatives for the proposed anesthesia with the patient or authorized representative who has indicated his/her understanding and acceptance.       Plan Discussed with: CRNA  Anesthesia Plan  Comments: (Discussed with patient and his son that ideally his stress test would have been completed prior to having elective surgery today, but based on his symptoms and history I believe that he is very low risk for complications related to anesthesia today and that it is safe to proceed with surgery. He and his son understand the risks and are in agreement. )        Anesthesia Quick Evaluation

## 2020-12-06 NOTE — Anesthesia Procedure Notes (Signed)
Procedure Name: MAC Date/Time: 12/06/2020 12:36 PM Performed by: Silvana Newness, CRNA Pre-anesthesia Checklist: Patient identified, Emergency Drugs available, Suction available, Patient being monitored and Timeout performed Patient Re-evaluated:Patient Re-evaluated prior to induction Oxygen Delivery Method: Nasal cannula Placement Confirmation: positive ETCO2

## 2020-12-07 ENCOUNTER — Encounter: Payer: Self-pay | Admitting: Ophthalmology

## 2020-12-09 ENCOUNTER — Other Ambulatory Visit: Payer: Self-pay

## 2020-12-09 ENCOUNTER — Encounter: Payer: Self-pay | Admitting: Ophthalmology

## 2020-12-16 NOTE — Discharge Instructions (Signed)

## 2020-12-20 ENCOUNTER — Encounter
Admission: RE | Disposition: A | Discharge status: Home / Self Care | Payer: Self-pay | Source: Home / Self Care | Attending: Ophthalmology

## 2020-12-20 ENCOUNTER — Ambulatory Visit
Admission: RE | Admit: 2020-12-20 | Discharge: 2020-12-20 | Disposition: A | Discharge status: Home / Self Care | Payer: Medicare Other | Attending: Ophthalmology | Admitting: Ophthalmology

## 2020-12-20 ENCOUNTER — Other Ambulatory Visit: Payer: Self-pay

## 2020-12-20 ENCOUNTER — Ambulatory Visit: Payer: Medicare Other | Admitting: Anesthesiology

## 2020-12-20 ENCOUNTER — Encounter: Payer: Self-pay | Admitting: Ophthalmology

## 2020-12-20 DIAGNOSIS — Z961 Presence of intraocular lens: Secondary | ICD-10-CM | POA: Diagnosis not present

## 2020-12-20 DIAGNOSIS — Z9842 Cataract extraction status, left eye: Secondary | ICD-10-CM | POA: Insufficient documentation

## 2020-12-20 DIAGNOSIS — Z86718 Personal history of other venous thrombosis and embolism: Secondary | ICD-10-CM | POA: Diagnosis not present

## 2020-12-20 DIAGNOSIS — Z7982 Long term (current) use of aspirin: Secondary | ICD-10-CM | POA: Insufficient documentation

## 2020-12-20 DIAGNOSIS — H2511 Age-related nuclear cataract, right eye: Secondary | ICD-10-CM | POA: Insufficient documentation

## 2020-12-20 DIAGNOSIS — Z8616 Personal history of COVID-19: Secondary | ICD-10-CM | POA: Insufficient documentation

## 2020-12-20 DIAGNOSIS — Z87891 Personal history of nicotine dependence: Secondary | ICD-10-CM | POA: Diagnosis not present

## 2020-12-20 DIAGNOSIS — I10 Essential (primary) hypertension: Secondary | ICD-10-CM | POA: Insufficient documentation

## 2020-12-20 DIAGNOSIS — Z794 Long term (current) use of insulin: Secondary | ICD-10-CM | POA: Insufficient documentation

## 2020-12-20 HISTORY — PX: CATARACT EXTRACTION W/PHACO: SHX586

## 2020-12-20 SURGERY — PHACOEMULSIFICATION, CATARACT, WITH IOL INSERTION
Anesthesia: Monitor Anesthesia Care | Site: Eye | Laterality: Right

## 2020-12-20 MED ORDER — FENTANYL CITRATE (PF) 100 MCG/2ML IJ SOLN
INTRAMUSCULAR | Status: DC | PRN
  Administered 2020-12-20: 50 ug via INTRAVENOUS

## 2020-12-20 MED ORDER — OXYCODONE HCL 5 MG PO TABS: 5.0000 mg | ORAL_TABLET | Freq: Once | ORAL | Status: DC | PRN

## 2020-12-20 MED ORDER — LACTATED RINGERS IV SOLN: INTRAVENOUS | Status: DC

## 2020-12-20 MED ORDER — MOXIFLOXACIN HCL 0.5 % OP SOLN
OPHTHALMIC | Status: DC | PRN
  Administered 2020-12-20: 0.2 mL via OPHTHALMIC

## 2020-12-20 MED ORDER — LIDOCAINE HCL (PF) 2 % IJ SOLN
INTRAOCULAR | Status: DC | PRN
  Administered 2020-12-20: 1 mL via INTRAOCULAR

## 2020-12-20 MED ORDER — MIDAZOLAM HCL 2 MG/2ML IJ SOLN
INTRAMUSCULAR | Status: DC | PRN
  Administered 2020-12-20: 2 mg via INTRAVENOUS

## 2020-12-20 MED ORDER — EPINEPHRINE PF 1 MG/ML IJ SOLN
INTRAOCULAR | Status: DC | PRN
  Administered 2020-12-20: 65 mL via OPHTHALMIC

## 2020-12-20 MED ORDER — ARMC OPHTHALMIC DILATING DROPS
1.0000 "application " | OPHTHALMIC | Status: DC | PRN
  Administered 2020-12-20 (×3): 1 via OPHTHALMIC

## 2020-12-20 MED ORDER — OXYCODONE HCL 5 MG/5ML PO SOLN: 5.0000 mg | Freq: Once | ORAL | Status: DC | PRN

## 2020-12-20 MED ORDER — TETRACAINE HCL 0.5 % OP SOLN
1.0000 [drp] | OPHTHALMIC | Status: DC | PRN
  Administered 2020-12-20 (×3): 1 [drp] via OPHTHALMIC

## 2020-12-20 MED ORDER — SODIUM HYALURONATE 10 MG/ML IO SOLN
INTRAOCULAR | Status: DC | PRN
  Administered 2020-12-20: 0.55 mL via INTRAOCULAR

## 2020-12-20 MED ORDER — SODIUM HYALURONATE 23 MG/ML IO SOLN
INTRAOCULAR | Status: DC | PRN
  Administered 2020-12-20: 0.6 mL via INTRAOCULAR

## 2020-12-20 SURGICAL SUPPLY — 17 items
CANNULA ANT/CHMB 27GA (MISCELLANEOUS) ×4 IMPLANT
DISSECTOR HYDRO NUCLEUS 50X22 (MISCELLANEOUS) ×2 IMPLANT
GLOVE PI ULTRA LF STRL 7.5 (GLOVE) ×1 IMPLANT
GLOVE PI ULTRA NON LATEX 7.5 (GLOVE) ×1
GLOVE SURG SYN 8.5  E (GLOVE) ×2
GLOVE SURG SYN 8.5 E (GLOVE) ×2 IMPLANT
GOWN STRL REUS W/ TWL LRG LVL3 (GOWN DISPOSABLE) ×2 IMPLANT
GOWN STRL REUS W/TWL LRG LVL3 (GOWN DISPOSABLE) ×4
LENS IOL TECNIS EYHANCE 17.5 (Intraocular Lens) ×2 IMPLANT
MARKER SKIN DUAL TIP RULER LAB (MISCELLANEOUS) ×2 IMPLANT
PACK DR. KING ARMS (PACKS) ×2 IMPLANT
PACK EYE AFTER SURG (MISCELLANEOUS) ×2 IMPLANT
PACK OPTHALMIC (MISCELLANEOUS) ×2 IMPLANT
SYR 3ML LL SCALE MARK (SYRINGE) ×2 IMPLANT
SYR TB 1ML LUER SLIP (SYRINGE) ×2 IMPLANT
WATER STERILE IRR 250ML POUR (IV SOLUTION) ×2 IMPLANT
WIPE NON LINTING 3.25X3.25 (MISCELLANEOUS) ×2 IMPLANT

## 2020-12-20 NOTE — Op Note (Signed)
OPERATIVE NOTE  Johnny Patterson [id number] 12/20/2020   PREOPERATIVE DIAGNOSIS:  Nuclear sclerotic cataract right eye.  H25.11   POSTOPERATIVE DIAGNOSIS:    Nuclear sclerotic cataract right eye.     PROCEDURE:  Phacoemusification with posterior chamber intraocular lens placement of the right eye   LENS:   Implant Name Type Inv. Item Serial No. Manufacturer Lot No. LRB No. Used Action  LENS IOL TECNIS EYHANCE 17.5 - [id number] Intraocular Lens LENS IOL TECNIS EYHANCE 17.5 [id number] JOHNSON   Right 1 Implanted       Procedure(s) with comments: CATARACT EXTRACTION PHACO AND INTRAOCULAR LENS PLACEMENT (IOC) RIGHT (Right) - 6.60 0:38.9  DIB00 +17.5   ULTRASOUND TIME: 0 minutes 38 seconds.  CDE 6.60   SURGEON:  Benay Pillow, MD, MPH  ANESTHESIOLOGIST: Anesthesiologist: Fidel Levy, MD CRNA: Cameron Ali, CRNA   ANESTHESIA:  Topical with tetracaine drops augmented with 1% preservative-free intracameral lidocaine.  ESTIMATED BLOOD LOSS: less than 1 mL.   COMPLICATIONS:  None.   DESCRIPTION OF PROCEDURE:  The patient was identified in the holding room and transported to the operating room and placed in the supine position under the operating microscope.  The right eye was identified as the operative eye and it was prepped and draped in the usual sterile ophthalmic fashion.   A 1.0 millimeter clear-corneal paracentesis was made at the 10:30 position. 0.5 ml of preservative-free 1% lidocaine with epinephrine was injected into the anterior chamber.  The anterior chamber was filled with Healon 5 viscoelastic.  A 2.4 millimeter keratome was used to make a near-clear corneal incision at the 8:00 position.  A curvilinear capsulorrhexis was made with a cystotome and capsulorrhexis forceps.  Balanced salt solution was used to hydrodissect and hydrodelineate the nucleus.   Phacoemulsification was then used in stop and chop fashion to remove the lens nucleus and epinucleus.  The remaining  cortex was then removed using the irrigation and aspiration handpiece. Healon was then placed into the capsular bag to distend it for lens placement.  A lens was then injected into the capsular bag.  The remaining viscoelastic was aspirated.   Wounds were hydrated with balanced salt solution.  The anterior chamber was inflated to a physiologic pressure with balanced salt solution.   Intracameral vigamox 0.1 mL undiluted was injected into the eye and a drop placed onto the ocular surface.  No wound leaks were noted.  The patient was taken to the recovery room in stable condition without complications of anesthesia or surgery  Benay Pillow 12/20/2020, 12:46 PM

## 2020-12-20 NOTE — Anesthesia Procedure Notes (Signed)
Procedure Name: MAC Date/Time: 12/20/2020 12:28 PM Performed by: Cameron Ali, CRNA Pre-anesthesia Checklist: Patient identified, Emergency Drugs available, Suction available, Timeout performed and Patient being monitored Patient Re-evaluated:Patient Re-evaluated prior to induction Oxygen Delivery Method: Nasal cannula Placement Confirmation: positive ETCO2

## 2020-12-20 NOTE — Transfer of Care (Signed)
Immediate Anesthesia Transfer of Care Note  Patient: Johnny Patterson  Procedure(s) Performed: CATARACT EXTRACTION PHACO AND INTRAOCULAR LENS PLACEMENT (IOC) RIGHT (Right Eye)  Patient Location: PACU  Anesthesia Type: MAC  Level of Consciousness: awake, alert  and patient cooperative  Airway and Oxygen Therapy: Patient Spontanous Breathing and Patient connected to supplemental oxygen  Post-op Assessment: Post-op Vital signs reviewed, Patient's Cardiovascular Status Stable, Respiratory Function Stable, Patent Airway and No signs of Nausea or vomiting  Post-op Vital Signs: Reviewed and stable  Complications: No complications documented.

## 2020-12-20 NOTE — Anesthesia Postprocedure Evaluation (Signed)
Anesthesia Post Note  Patient: Johnny Patterson  Procedure(s) Performed: CATARACT EXTRACTION PHACO AND INTRAOCULAR LENS PLACEMENT (IOC) RIGHT (Right Eye)     Patient location during evaluation: PACU Anesthesia Type: MAC Level of consciousness: awake and alert Pain management: pain level controlled Vital Signs Assessment: post-procedure vital signs reviewed and stable Respiratory status: spontaneous breathing, nonlabored ventilation, respiratory function stable and patient connected to nasal cannula oxygen Cardiovascular status: stable and blood pressure returned to baseline Postop Assessment: no apparent nausea or vomiting Anesthetic complications: no   No complications documented.  Fidel Levy

## 2020-12-20 NOTE — Anesthesia Preprocedure Evaluation (Signed)
Anesthesia Evaluation  Patient identified by MRN, date of birth, ID band Patient awake    Reviewed: NPO status   History of Anesthesia Complications Negative for: history of anesthetic complications  Airway Mallampati: II  TM Distance: >3 FB Neck ROM: full    Dental  (+) Missing   Pulmonary neg pulmonary ROS, former smoker,    Pulmonary exam normal        Cardiovascular Exercise Tolerance: Good hypertension, (-) angina+ DVT (2005)  Normal cardiovascular exam     Neuro/Psych negative neurological ROS  negative psych ROS   GI/Hepatic negative GI ROS, Neg liver ROS,   Endo/Other  Morbid obesity (bmi 28)  Renal/GU negative Renal ROS  negative genitourinary   Musculoskeletal   Abdominal   Peds  Hematology negative hematology ROS (+)   Anesthesia Other Findings pcp: Cheryll Cockayne, MD at 11/23/2020 ;  12/15/2020  1. Negative ETT, though specificity limited by RBBB 2. Normal left ventricular function 3. Normal wall motion 4. No evidence for scar or ischemia   had ECCE on 12/06/2020  COVID+ 10/2019   Reproductive/Obstetrics                             Anesthesia Physical Anesthesia Plan  ASA: II  Anesthesia Plan: MAC   Post-op Pain Management:    Induction:   PONV Risk Score and Plan: 1 and Midazolam  Airway Management Planned:   Additional Equipment:   Intra-op Plan:   Post-operative Plan:   Informed Consent: I have reviewed the patients History and Physical, chart, labs and discussed the procedure including the risks, benefits and alternatives for the proposed anesthesia with the patient or authorized representative who has indicated his/her understanding and acceptance.       Plan Discussed with: CRNA  Anesthesia Plan Comments:         Anesthesia Quick Evaluation

## 2020-12-20 NOTE — H&P (Signed)
[facility]   Primary Care Physician:  Adin Hector, MD Ophthalmologist: Dr. Benay Pillow  Pre-Procedure History & Physical: HPI:  Johnny Patterson is a 80 y.o. male here for cataract surgery.   Past Medical History:  Diagnosis Date  . Basal cell carcinoma 01/19/2016   R sup nasal ala   . COVID-19 10/2019  . DVT (deep venous thrombosis) ([place]) 2005   left.  Resolved.  . Hypertension     Past Surgical History:  Procedure Laterality Date  . CATARACT EXTRACTION W/PHACO Left 12/06/2020   Procedure: CATARACT EXTRACTION PHACO AND INTRAOCULAR LENS PLACEMENT (IOC) LEFT 11.70 01:00.3;  Surgeon: Eulogio Bear, MD;  Location: [place];  Service: Ophthalmology;  Laterality: Left;  . TREATMENT FISTULA ANAL      Prior to Admission medications   Medication Sig Start Date End Date Taking? Authorizing Provider  lisinopril-hydrochlorothiazide (ZESTORETIC) 20-25 MG tablet Take 1 tablet by mouth daily. 12/01/19  Yes [provider]  aspirin 325 MG EC tablet Take by mouth.    [provider]    Allergies as of 10/05/2020  . (Not on File)    History reviewed. No pertinent family history.  Social History   Socioeconomic History  . Marital status: Divorced    Spouse name: Not on file  . Number of children: Not on file  . Years of education: Not on file  . Highest education level: Not on file  Occupational History  . Not on file  Tobacco Use  . Smoking status: Former Smoker    Types: Cigarettes    Quit date: 1990    Years since quitting: 32.3  . Smokeless tobacco: Never Used  Vaping Use  . Vaping Use: Never used  Substance and Sexual Activity  . Alcohol use: Yes    Comment: rare  . Drug use: Not on file  . Sexual activity: Not on file  Other Topics Concern  . Not on file  Social History Narrative  . Not on file   Social Determinants of Health   Financial Resource Strain: Not on file  Food Insecurity: Not on file  Transportation  Needs: Not on file  Physical Activity: Not on file  Stress: Not on file  Social Connections: Not on file  Intimate Partner Violence: Not on file    Review of Systems: See HPI, otherwise negative ROS  Physical Exam: BP 140/70   Pulse 72   Temp 98.2 F (36.8 C) (Temporal)   Resp 18   Ht 6' (1.829 m)   Wt 92.5 kg   SpO2 100%   BMI 27.67 kg/m  General:   Alert,  pleasant and cooperative in NAD Head:  Normocephalic and atraumatic. Respiratory:  Normal work of breathing. Cardiovascular:  RRR  Impression/Plan: Johnny Patterson is here for cataract surgery.  Risks, benefits, limitations, and alternatives regarding cataract surgery have been reviewed with the patient.  Questions have been answered.  All parties agreeable.   Benay Pillow, MD  12/20/2020, 12:22 PM

## 2020-12-21 ENCOUNTER — Encounter: Payer: Self-pay | Admitting: Ophthalmology

## 2021-09-29 ENCOUNTER — Other Ambulatory Visit: Payer: Self-pay | Admitting: Internal Medicine

## 2021-09-29 DIAGNOSIS — M519 Unspecified thoracic, thoracolumbar and lumbosacral intervertebral disc disorder: Secondary | ICD-10-CM

## 2021-09-29 DIAGNOSIS — M5416 Radiculopathy, lumbar region: Secondary | ICD-10-CM

## 2021-10-08 ENCOUNTER — Ambulatory Visit
Admission: RE | Admit: 2021-10-08 | Discharge: 2021-10-08 | Disposition: A | Discharge status: Home / Self Care | Payer: Medicare Other | Source: Ambulatory Visit | Attending: Internal Medicine | Admitting: Internal Medicine

## 2021-10-08 ENCOUNTER — Other Ambulatory Visit: Payer: Self-pay

## 2021-10-08 DIAGNOSIS — M519 Unspecified thoracic, thoracolumbar and lumbosacral intervertebral disc disorder: Secondary | ICD-10-CM | POA: Insufficient documentation

## 2021-10-08 DIAGNOSIS — M5416 Radiculopathy, lumbar region: Secondary | ICD-10-CM | POA: Insufficient documentation

## 2021-11-02 ENCOUNTER — Ambulatory Visit: Payer: Medicare Other | Admitting: Dermatology

## 2021-11-02 ENCOUNTER — Encounter: Payer: Self-pay | Admitting: Dermatology

## 2021-11-02 ENCOUNTER — Other Ambulatory Visit: Payer: Self-pay

## 2021-11-02 DIAGNOSIS — D229 Melanocytic nevi, unspecified: Secondary | ICD-10-CM

## 2021-11-02 DIAGNOSIS — D1800 Hemangioma unspecified site: Secondary | ICD-10-CM

## 2021-11-02 DIAGNOSIS — L820 Inflamed seborrheic keratosis: Secondary | ICD-10-CM | POA: Diagnosis not present

## 2021-11-02 DIAGNOSIS — L821 Other seborrheic keratosis: Secondary | ICD-10-CM

## 2021-11-02 DIAGNOSIS — Z85828 Personal history of other malignant neoplasm of skin: Secondary | ICD-10-CM

## 2021-11-02 DIAGNOSIS — Z1283 Encounter for screening for malignant neoplasm of skin: Secondary | ICD-10-CM

## 2021-11-02 DIAGNOSIS — Z85820 Personal history of malignant melanoma of skin: Secondary | ICD-10-CM

## 2021-11-02 DIAGNOSIS — L578 Other skin changes due to chronic exposure to nonionizing radiation: Secondary | ICD-10-CM

## 2021-11-02 DIAGNOSIS — L814 Other melanin hyperpigmentation: Secondary | ICD-10-CM

## 2021-11-02 DIAGNOSIS — L570 Actinic keratosis: Secondary | ICD-10-CM

## 2021-11-02 NOTE — Patient Instructions (Signed)

## 2021-11-02 NOTE — Progress Notes (Signed)
? ?Follow-Up Visit ?  ?Subjective  ?Johnny Patterson is a 81 y.o. 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 The patient presents for Total-Body Skin Exam (TBSE) for skin cancer screening and mole check.  The patient has spots, moles and lesions to be evaluated, some may be new or changing. ? ?The following portions of the chart were reviewed this encounter and updated as appropriate:  ? Tobacco  Allergies  Meds  Problems  Med Hx  Surg Hx  Fam Hx   ?  ?Review of Systems:  No other skin or systemic complaints except as noted in HPI or Assessment and Plan. ? ?Objective  ?Well appearing patient in no apparent distress; mood and affect are within normal limits. ? ?A full examination was performed including scalp, head, eyes, ears, nose, lips, neck, chest, axillae, abdomen, back, buttocks, bilateral upper extremities, bilateral lower extremities, hands, feet, fingers, toes, fingernails, and toenails. All findings within normal limits unless otherwise noted below. ? ?R temple x 1, L cheek x 1 (2) ?Erythematous thin papules/macules with gritty scale.  ? ?B/L arms x 20 (20) ?Erythematous stuck-on, waxy papule or plaque ? ? ?Assessment & Plan  ?AK (actinic keratosis) (2) ?R temple x 1, L cheek x 1 ? ?Destruction of lesion - R temple x 1, L cheek x 1 ?Complexity: simple   ?Destruction method: cryotherapy   ?Informed consent: discussed and consent obtained   ?Timeout:  patient name, date of birth, surgical site, and procedure verified ?Lesion destroyed using liquid nitrogen: Yes   ?Region frozen until ice ball extended beyond lesion: Yes   ?Outcome: patient tolerated procedure well with no complications   ?Post-procedure details: wound care instructions given   ? ?Inflamed seborrheic keratosis (20) ?B/L arms x 20 ? ?Destruction of lesion - B/L arms x 20 ?Complexity: simple   ?Destruction method: cryotherapy   ?Informed consent: discussed and consent obtained    ?Timeout:  patient name, date of birth, surgical site, and procedure verified ?Lesion destroyed using liquid nitrogen: Yes   ?Region frozen until ice ball extended beyond lesion: Yes   ?Outcome: patient tolerated procedure well with no complications   ?Post-procedure details: wound care instructions given   ? ?Skin cancer screening ? ?Lentigines ?- Scattered tan macules ?- Due to sun exposure ?- Benign-appearing, observe ?- Recommend daily broad spectrum sunscreen SPF 30+ to sun-exposed areas, reapply every 2 hours as needed. ?- Call for any changes ? ?Seborrheic Keratoses ?- Stuck-on, waxy, tan-brown papules and/or plaques  ?- Benign-appearing ?- Discussed benign etiology and prognosis. ?- Observe ?- Call for any changes ? ?Melanocytic Nevi ?- Tan-brown and/or pink-flesh-colored symmetric macules and papules ?- Benign appearing on exam today ?- Observation ?- Call clinic for new or changing moles ?- Recommend daily use of broad spectrum spf 30+ sunscreen to sun-exposed areas.  ? ?Hemangiomas ?- Red papules ?- Discussed benign nature ?- Observe ?- Call for any changes ? 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  ?- Staying in the shade or wearing long sleeves, sun glasses (UVA+UVB protection) and wide brim hats (4-inch brim around the entire circumference of the hat) are also recommended for sun protection.  ?- Call for new or changing lesions. ? ?History of Basal Cell Carcinoma of the  Skin ?- No evidence of recurrence today ?- Recommend regular full body skin exams ?- Recommend daily broad spectrum sunscreen SPF 30+ to sun-exposed areas, reapply every 2 hours as needed.  ?- Call if any new or changing lesions are noted between office visits ? ?History of Melanoma - R temple  ?- No evidence of recurrence  today ?- No lymphadenopathy ?- Recommend regular full body skin exams ?- Recommend daily broad spectrum sunscreen SPF 30+ to sun-exposed areas, reapply every 2 hours as needed.  ?- Call if any new or changing lesions are noted between office visits ? ?Skin cancer screening performed today. ? ?Return in about 1 year (around 11/03/2022) for TBSE. ? ?I, Rudell Cobb, CMA, am acting as scribe for Sarina Ser, MD . ?Documentation: I have reviewed the above documentation for accuracy and completeness, and I agree with the above. ? ?Sarina Ser, MD ? ?

## 2021-11-04 ENCOUNTER — Encounter: Payer: Self-pay | Admitting: Dermatology

## 2022-07-04 IMAGING — MR MR LUMBAR SPINE W/O CM
5 series · 30 of 48 positions shown · non-contrast
Comparison: 10/29/2018

CLINICAL DATA: Chronic low back pain, recent lifting injury,
radiates into left leg

EXAM:
MRI LUMBAR SPINE WITHOUT CONTRAST
TECHNIQUE: Multiplanar, multisequence MR imaging of the lumbar spine was
performed. No intravenous contrast was administered.

[Series 5: T2 · sagittal · 4.0mm · 0.81mm/px · 6 of 17 slices shown (1 of 2)]
[im 1/17]
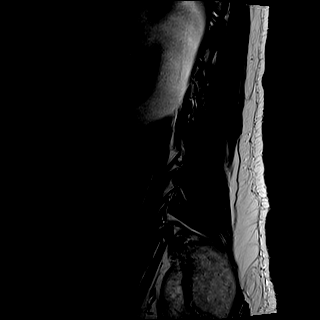
[im 4/17]
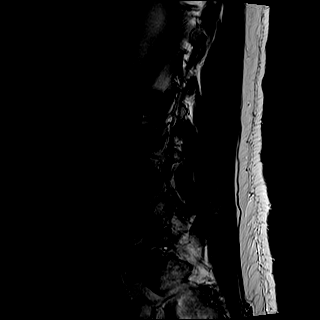
[im 7/17]
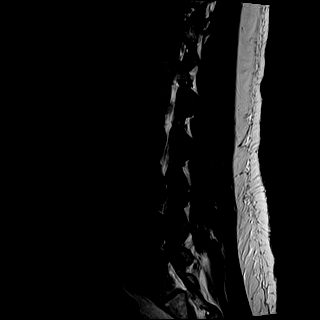
[im 10/17]
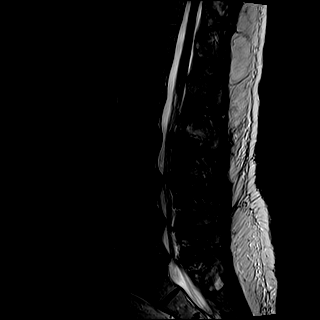
[im 13/17]
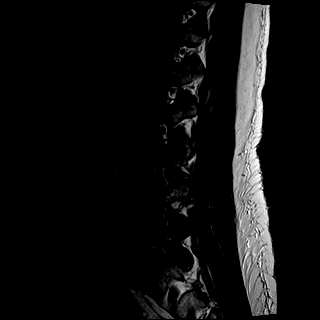
[im 17/17]
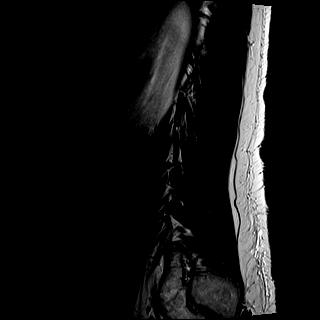

[Series 6: T1 · sagittal · 4.0mm · 0.81mm/px · 7 of 17 slices shown (1 of 2)]
[im 1/17]
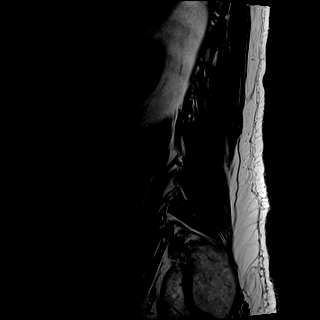
[im 3/17]
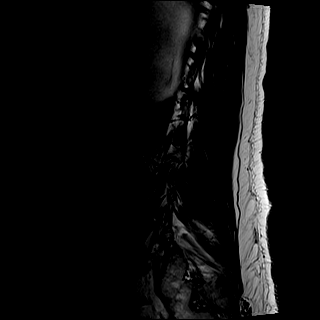
[im 6/17]
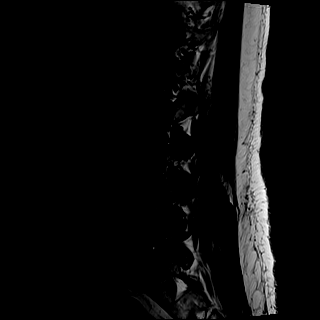
[im 9/17]
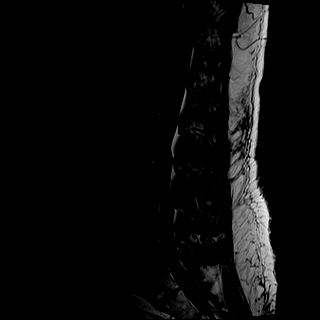
[im 11/17]
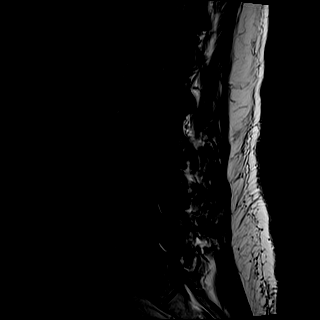
[im 14/17]
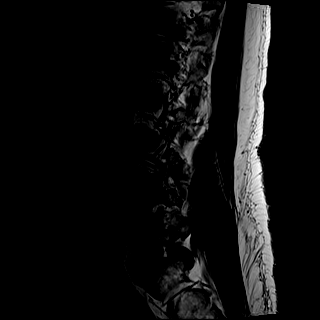
[im 17/17]
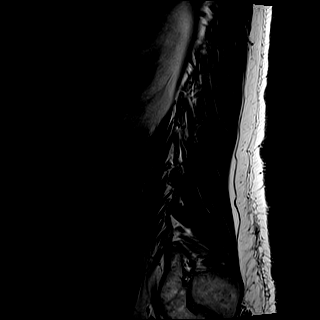

[Series 7: STIR · sagittal · 4.0mm · 0.41mm/px · 1 of 17 slices shown]
[im 1/17]
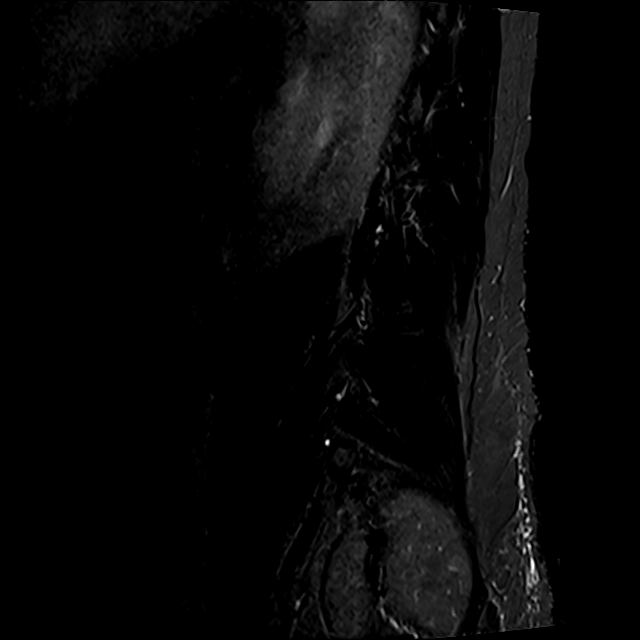

[Series 8: T2 · axial · 4.0mm · 0.78mm/px · z∈[-104,+116]mm · 8 of 36 slices shown (2 of 2)]
[im 1/36]
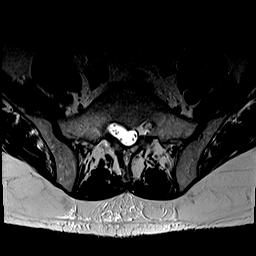
[im 6/36]
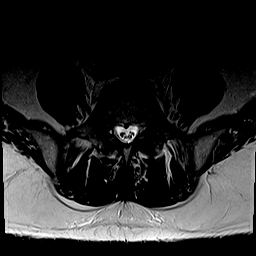
[im 11/36]
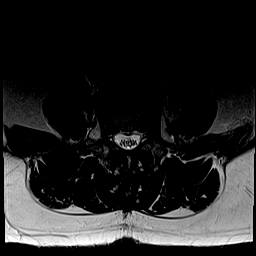
[im 17/36]
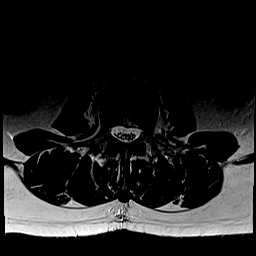
[im 19/36]
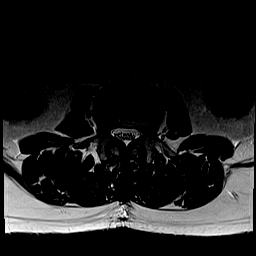
[im 25/36]
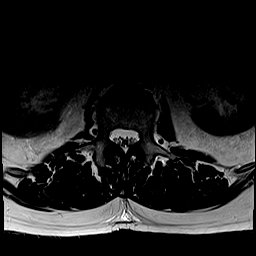
[im 30/36]
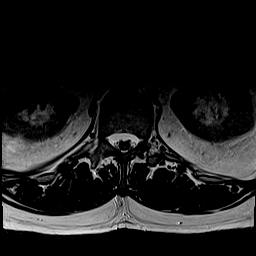
[im 36/36]
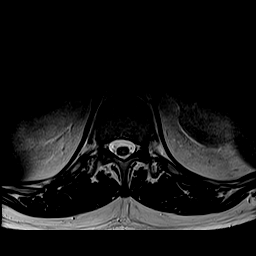

[Series 9: T1 · axial · 4.0mm · 0.39mm/px · z∈[-104,+116]mm · 8 of 36 slices shown (2 of 2)]
[im 1/36]
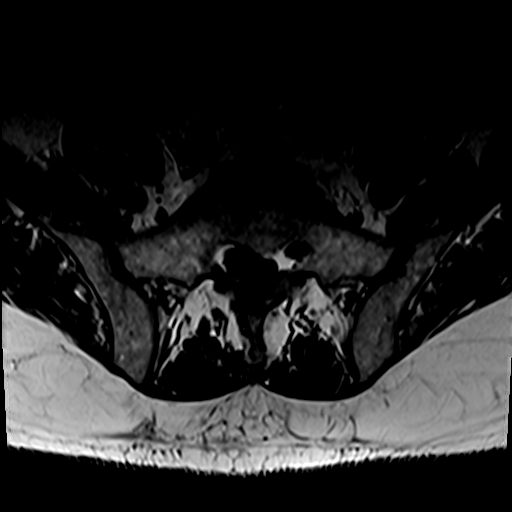
[im 6/36]
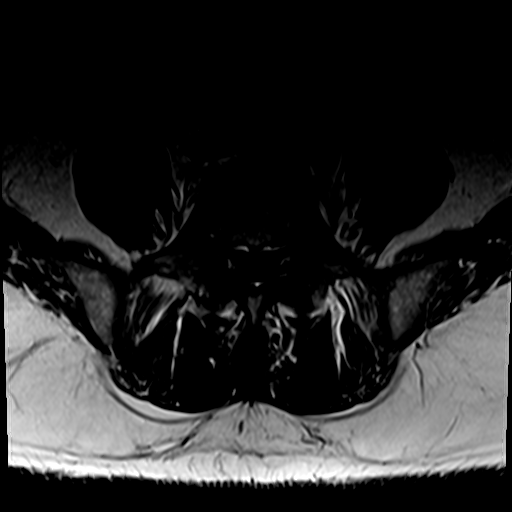
[im 11/36]
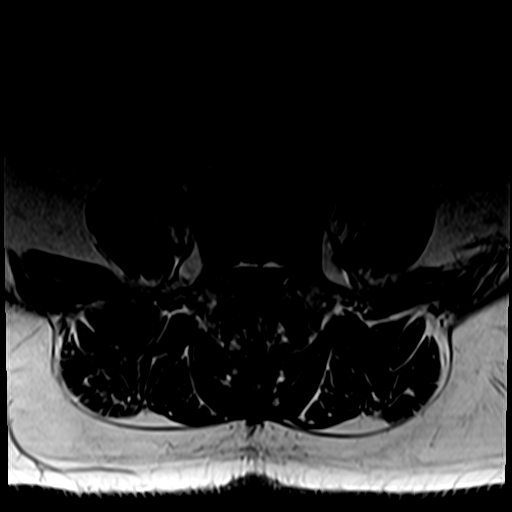
[im 17/36]
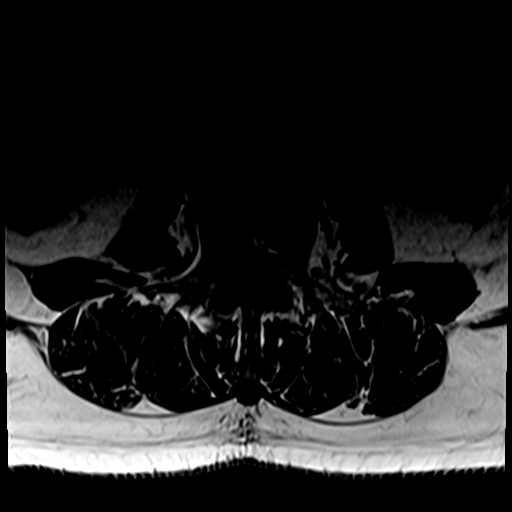
[im 19/36]
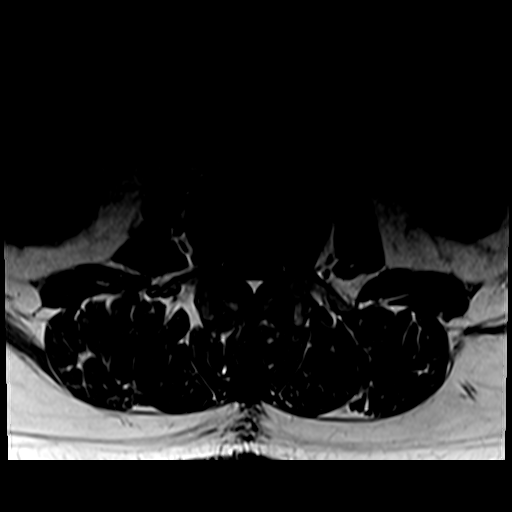
[im 25/36]
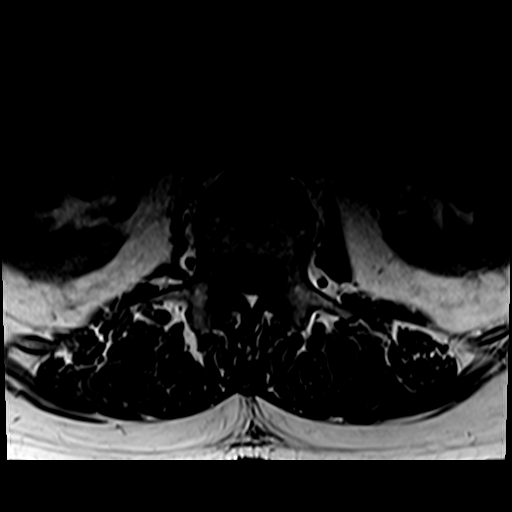
[im 30/36]
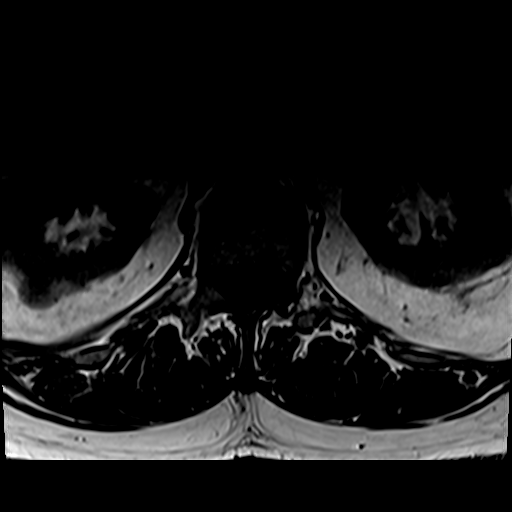
[im 36/36]
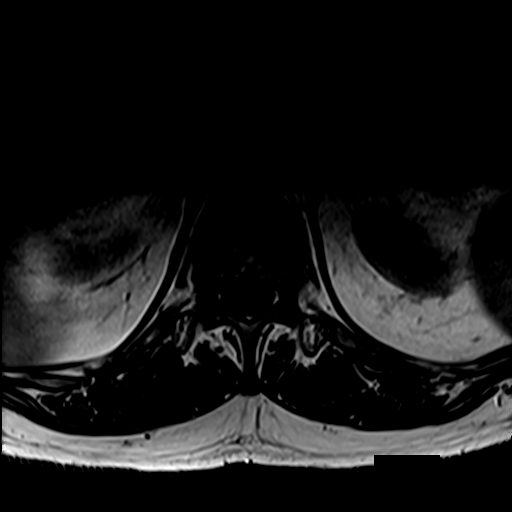

[30 of 48 positions shown; findings below may reference images not displayed]

FINDINGS: Segmentation:  Standard.

Alignment:  Trace levocurvature.  No significant listhesis.

Vertebrae:  No acute fracture or suspicious osseous lesion.

Conus medullaris and cauda equina: Conus extends to the L2 level.
Conus and cauda equina appear normal.

Paraspinal and other soft tissues: Renal cysts.

Disc levels:

T12-L1: No significant disc bulge. No spinal canal stenosis or
neural foraminal narrowing.

L1-L2: No significant disc bulge. No spinal canal stenosis or neural
foraminal narrowing.

L2-L3: Mild disc bulge. Mild facet arthropathy. No spinal canal
stenosis. Narrowing of the lateral recesses. Mild left neural
foraminal narrowing, which is new from the prior exam.

L3-L4: Moderate disc bulge. Moderate facet arthropathy, with
decreased size of previously noted anteriorly directed left facet
synovial cyst (series 8, image 23). Moderate spinal canal stenosis,
overall unchanged, with effacement of the left lateral recess and
narrowing of the right lateral recess. Moderate left and mild right
neural foraminal narrowing unchanged. The exiting left L3 nerve
appears to contact the foraminal portion of the disc bulge.

L4-L5: Mild disc bulge. Mild facet arthropathy. Narrowing of the
lateral recesses. Mild spinal canal stenosis, unchanged. Mild right
neural foraminal narrowing, unchanged.

L5-S1: Broad-based disc bulge with central protrusion with right
paracentral annular fissure, overall slightly increased compared to
the prior. Mild facet arthropathy. Effacement of the left lateral
recess. Narrowing of the right lateral recess. Mild spinal canal
stenosis, which is new from the prior exam. Mild-to-moderate
bilateral neural foraminal narrowing, likely unchanged.
IMPRESSION: 1. L5-S1 mild spinal canal stenosis, which is new from the prior
exam, and mild-to-moderate bilateral neural foraminal narrowing.
Effacement of the left lateral recess, which has increased from the
prior, at this level likely compresses the descending left S1 nerve
roots. Narrowing of the right lateral recess could also affect the
descending right S1 nerve roots.
2. L3-L4 moderate spinal canal stenosis, with moderate left and mild
right neural foraminal narrowing. The exiting left L3 nerve root
appears to contact foraminal portion of the disc bulge. Effacement
of the left lateral recess at this level likely compresses the
descending left L4 nerve roots. Narrowing of the right lateral
recess could affect the descending right L4 nerve roots.
3. L4-L5 mild spinal canal stenosis and mild right neural foraminal
narrowing. Narrowing of the lateral recesses at this level could
affect the descending L5 nerve roots.
4. L2-L3 mild left neural foraminal narrowing, which is new from the
prior exam. Narrowing of the lateral recesses at this level could
affect the descending L3 nerve roots.

## 2022-11-06 ENCOUNTER — Ambulatory Visit: Payer: Medicare Other | Admitting: Dermatology

## 2022-11-06 VITALS — BP 115/63 | HR 81

## 2022-11-06 DIAGNOSIS — Z85828 Personal history of other malignant neoplasm of skin: Secondary | ICD-10-CM

## 2022-11-06 DIAGNOSIS — Z1283 Encounter for screening for malignant neoplasm of skin: Secondary | ICD-10-CM

## 2022-11-06 DIAGNOSIS — D229 Melanocytic nevi, unspecified: Secondary | ICD-10-CM

## 2022-11-06 DIAGNOSIS — Z85820 Personal history of malignant melanoma of skin: Secondary | ICD-10-CM

## 2022-11-06 DIAGNOSIS — L578 Other skin changes due to chronic exposure to nonionizing radiation: Secondary | ICD-10-CM

## 2022-11-06 DIAGNOSIS — L814 Other melanin hyperpigmentation: Secondary | ICD-10-CM

## 2022-11-06 DIAGNOSIS — D225 Melanocytic nevi of trunk: Secondary | ICD-10-CM

## 2022-11-06 DIAGNOSIS — L821 Other seborrheic keratosis: Secondary | ICD-10-CM

## 2022-11-06 NOTE — Patient Instructions (Signed)
Due to recent changes in healthcare laws, you may see results of your pathology and/or laboratory studies on MyChart before the doctors have had a chance to review them. We understand that in some cases there may be results that are confusing or concerning to you. Please understand that not all results are received at the same time and often the doctors may need to interpret multiple results in order to provide you with the best plan of care or course of treatment. Therefore, we ask that you please give us 2 business days to thoroughly review all your results before contacting the office for clarification. Should we see a critical lab result, you will be contacted sooner.   If You Need Anything After Your Visit  If you have any questions or concerns for your doctor, please call our main line at [phone] and press option 4 to reach your doctor's medical assistant. If no one answers, please leave a voicemail as directed and we will return your call as soon as possible. Messages left after 4 pm will be answered the following business day.   You may also send us a message via MyChart. We typically respond to MyChart messages within 1-2 business days.  For prescription refills, please ask your pharmacy to contact our office. Our fax number is [phone].  If you have an urgent issue when the clinic is closed that cannot wait until the next business day, you can page your doctor at the number below.    Please note that while we do our best to be available for urgent issues outside of office hours, we are not available 24/7.   If you have an urgent issue and are unable to reach us, you may choose to seek medical care at your doctor's office, retail clinic, urgent care center, or emergency room.  If you have a medical emergency, please immediately call 911 or go to the emergency department.  Pager Numbers  - Dr. Kowalski: [phone]  - Dr. Moye: [phone]  - Dr. Stewart:  [phone]  In the event of inclement weather, please call our main line at [phone] for an update on the status of any delays or closures.  Dermatology Medication Tips: Please keep the boxes that topical medications come in in order to help keep track of the instructions about where and how to use these. Pharmacies typically print the medication instructions only on the boxes and not directly on the medication tubes.   If your medication is too expensive, please contact our office at [phone] option 4 or send us a message through MyChart.   We are unable to tell what your co-pay for medications will be in advance as this is different depending on your insurance coverage. However, we may be able to find a substitute medication at lower cost or fill out paperwork to get insurance to cover a needed medication.   If a prior authorization is required to get your medication covered by your insurance company, please allow us 1-2 business days to complete this process.  Drug prices often vary depending on where the prescription is filled and some pharmacies may offer cheaper prices.  The website www.goodrx.com contains coupons for medications through different pharmacies. The prices here do not account for what the cost may be with help from insurance (it may be cheaper with your insurance), but the website can give you the price if you did not use any insurance.  - You can print the associated coupon and take it with   your prescription to the pharmacy.  - You may also stop by our office during regular business hours and pick up a GoodRx coupon card.  - If you need your prescription sent electronically to a different pharmacy, notify our office through [place] MyChart or by phone at [phone] option 4.     Si Usted Necesita Algo Despus de Su Visita  Tambin puede enviarnos un mensaje a travs de MyChart. Por lo general respondemos a los mensajes de MyChart en el transcurso de 1 a 2  das hbiles.  Para renovar recetas, por favor pida a su farmacia que se ponga en contacto con nuestra oficina. Nuestro nmero de fax es el [phone].  Si tiene un asunto urgente cuando la clnica est cerrada y que no puede esperar hasta el siguiente da hbil, puede llamar/localizar a su doctor(a) al nmero que aparece a continuacin.   Por favor, tenga en cuenta que aunque hacemos todo lo posible para estar disponibles para asuntos urgentes fuera del horario de oficina, no estamos disponibles las 24 horas del da, los 7 das de la semana.   Si tiene un problema urgente y no puede comunicarse con nosotros, puede optar por buscar atencin mdica  en el consultorio de su doctor(a), en una clnica privada, en un centro de atencin urgente o en una sala de emergencias.  Si tiene una emergencia mdica, por favor llame inmediatamente al 911 o vaya a la sala de emergencias.  Nmeros de bper  - Dr. Kowalski: [phone]  - Dra. Moye: [phone]  - Dra. Stewart: [phone]  En caso de inclemencias del tiempo, por favor llame a nuestra lnea principal al [phone] para una actualizacin sobre el estado de cualquier retraso o cierre.  Consejos para la medicacin en dermatologa: Por favor, guarde las cajas en las que vienen los medicamentos de uso tpico para ayudarle a seguir las instrucciones sobre dnde y cmo usarlos. Las farmacias generalmente imprimen las instrucciones del medicamento slo en las cajas y no directamente en los tubos del medicamento.   Si su medicamento es muy caro, por favor, pngase en contacto con nuestra oficina llamando al [phone] y presione la opcin 4 o envenos un mensaje a travs de MyChart.   No podemos decirle cul ser su copago por los medicamentos por adelantado ya que esto es diferente dependiendo de la cobertura de su seguro. Sin embargo, es posible que podamos encontrar un medicamento sustituto a menor costo o llenar un formulario para que el  seguro cubra el medicamento que se considera necesario.   Si se requiere una autorizacin previa para que su compaa de seguros cubra su medicamento, por favor permtanos de 1 a 2 das hbiles para completar este proceso.  Los precios de los medicamentos varan con frecuencia dependiendo del lugar de dnde se surte la receta y alguna farmacias pueden ofrecer precios ms baratos.  El sitio web www.goodrx.com tiene cupones para medicamentos de diferentes farmacias. Los precios aqu no tienen en cuenta lo que podra costar con la ayuda del seguro (puede ser ms barato con su seguro), pero el sitio web puede darle el precio si no utiliz ningn seguro.  - Puede imprimir el cupn correspondiente y llevarlo con su receta a la farmacia.  - Tambin puede pasar por nuestra oficina durante el horario de atencin regular y recoger una tarjeta de cupones de GoodRx.  - Si necesita que su receta se enve electrnicamente a una farmacia diferente, informe a nuestra oficina a travs de MyChart de    o por telfono llamando al [phone] y presione la opcin 4.  

## 2022-11-06 NOTE — Progress Notes (Signed)
Follow-Up Visit   Subjective  Johnny Patterson is a 82 y.o. male who presents for the following: Annual Exam. Hx of Melanoma, hx of BCC The patient presents for Total-Body Skin Exam (TBSE) for skin cancer screening and mole check.  The patient has spots, moles and lesions to be evaluated, some may be new or changing and the patient has concerns that these could be cancer.   The following portions of the chart were reviewed this encounter and updated as appropriate:   Tobacco  Allergies  Meds  Problems  Med Hx  Surg Hx  Fam Hx     Review of Systems:  No other skin or systemic complaints except as noted in HPI or Assessment and Plan.  Objective  Well appearing patient in no apparent distress; mood and affect are within normal limits.  A full examination was performed including scalp, head, eyes, ears, nose, lips, neck, chest, axillae, abdomen, back, buttocks, bilateral upper extremities, bilateral lower extremities, hands, feet, fingers, toes, fingernails, and toenails. All findings within normal limits unless otherwise noted below.  chest - Tan-brown and/or pink-flesh-colored symmetric macules and papules   Assessment & Plan  Nevus chest - Benign appearing on exam today - Observation - Call clinic for new or changing moles - Recommend daily use of broad spectrum spf 30+ sunscreen to sun-exposed areas.    Lentigines - Scattered tan macules - Due to sun exposure - Benign-appearing, observe - Recommend daily broad spectrum sunscreen SPF 30+ to sun-exposed areas, reapply every 2 hours as needed. - Call for any changes  Seborrheic Keratoses - Stuck-on, waxy, tan-brown papules and/or plaques  - Benign-appearing - Discussed benign etiology and prognosis. - Observe - Call for any changes  Melanocytic Nevi - Tan-brown and/or pink-flesh-colored symmetric macules and papules - Benign appearing on exam today - Observation - Call clinic for new or changing moles -  Recommend daily use of broad spectrum spf 30+ sunscreen to sun-exposed areas.   Hemangiomas - Red papules - Discussed benign nature - Observe - Call for any changes  Actinic Damage - Chronic condition, secondary to cumulative UV/sun exposure - diffuse scaly erythematous macules with underlying dyspigmentation - Recommend daily broad spectrum sunscreen SPF 30+ to sun-exposed areas, reapply every 2 hours as needed.  - Staying in the shade or wearing long sleeves, sun glasses (UVA+UVB protection) and wide brim hats (4-inch brim around the entire circumference of the hat) are also recommended for sun protection.  - Call for new or changing lesions.  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  - Call if any new or changing lesions are noted between office visits   History of Melanoma Right temple  - No evidence of recurrence today - No lymphadenopathy - Recommend regular full body skin exams - Recommend daily broad spectrum sunscreen SPF 30+ to sun-exposed areas, reapply every 2 hours as needed.  - Call if any new or changing lesions are noted between office visits   Skin cancer screening performed today.   Return in about 1 year (around 11/06/2023) for TBSE, hx of Melanoma, hx of BCC.  IMarye Round, CMA, am acting as scribe for Sarina Ser, MD .  Documentation: I have reviewed the above documentation for accuracy and completeness, and I agree with the above.  Sarina Ser, MD

## 2022-11-11 ENCOUNTER — Encounter: Payer: Self-pay | Admitting: Dermatology

## 2023-11-07 ENCOUNTER — Ambulatory Visit: Payer: Medicare Other | Admitting: Dermatology

## 2024-01-07 ENCOUNTER — Encounter: Payer: Self-pay | Admitting: Dermatology

## 2024-01-07 ENCOUNTER — Ambulatory Visit: Admitting: Dermatology

## 2024-01-07 DIAGNOSIS — L570 Actinic keratosis: Secondary | ICD-10-CM | POA: Diagnosis not present

## 2024-01-07 DIAGNOSIS — Z85820 Personal history of malignant melanoma of skin: Secondary | ICD-10-CM

## 2024-01-07 DIAGNOSIS — C499 Malignant neoplasm of connective and soft tissue, unspecified: Secondary | ICD-10-CM

## 2024-01-07 DIAGNOSIS — D492 Neoplasm of unspecified behavior of bone, soft tissue, and skin: Secondary | ICD-10-CM | POA: Diagnosis not present

## 2024-01-07 DIAGNOSIS — D489 Neoplasm of uncertain behavior, unspecified: Secondary | ICD-10-CM

## 2024-01-07 DIAGNOSIS — D229 Melanocytic nevi, unspecified: Secondary | ICD-10-CM

## 2024-01-07 DIAGNOSIS — C4492 Squamous cell carcinoma of skin, unspecified: Secondary | ICD-10-CM

## 2024-01-07 DIAGNOSIS — D0461 Carcinoma in situ of skin of right upper limb, including shoulder: Secondary | ICD-10-CM | POA: Diagnosis not present

## 2024-01-07 DIAGNOSIS — D1801 Hemangioma of skin and subcutaneous tissue: Secondary | ICD-10-CM

## 2024-01-07 DIAGNOSIS — Z85828 Personal history of other malignant neoplasm of skin: Secondary | ICD-10-CM

## 2024-01-07 DIAGNOSIS — Z1283 Encounter for screening for malignant neoplasm of skin: Secondary | ICD-10-CM

## 2024-01-07 DIAGNOSIS — W908XXA Exposure to other nonionizing radiation, initial encounter: Secondary | ICD-10-CM | POA: Diagnosis not present

## 2024-01-07 DIAGNOSIS — C4911 Malignant neoplasm of connective and soft tissue of right upper limb, including shoulder: Secondary | ICD-10-CM

## 2024-01-07 DIAGNOSIS — L821 Other seborrheic keratosis: Secondary | ICD-10-CM

## 2024-01-07 DIAGNOSIS — L578 Other skin changes due to chronic exposure to nonionizing radiation: Secondary | ICD-10-CM

## 2024-01-07 DIAGNOSIS — L814 Other melanin hyperpigmentation: Secondary | ICD-10-CM

## 2024-01-07 HISTORY — DX: Squamous cell carcinoma of skin, unspecified: C44.92

## 2024-01-07 NOTE — Progress Notes (Signed)
 Follow-Up Visit   Subjective  Johnny Patterson is a 83 y.o. male who presents for the following: Skin Cancer Screening and Full Body Skin Exam Hx of melanoma Hx of ask Spot at right hand he is concerned about that keeps coming back   The patient presents for Total-Body Skin Exam (TBSE) for skin cancer screening and mole check. The patient has spots, moles and lesions to be evaluated, some may be new or changing and the patient may have concern these could be cancer.  The following portions of the chart were reviewed this encounter and updated as appropriate: medications, allergies, medical history  Review of Systems:  No other skin or systemic complaints except as noted in HPI or Assessment and Plan.  Objective  Well appearing patient in no apparent distress; mood and affect are within normal limits.  A full examination was performed including scalp, head, eyes, ears, nose, lips, neck, chest, axillae, abdomen, back, buttocks, bilateral upper extremities, bilateral lower extremities, hands, feet, fingers, toes, fingernails, and toenails. All findings within normal limits unless otherwise noted below.   Relevant physical exam findings are noted in the Assessment and Plan.  face x 6 (6) Erythematous thin papules/macules with gritty scale.  right dorsal hand proximal to little finger 2.1 cm Linear hyperkeratotic papule    Assessment & Plan   SKIN CANCER SCREENING PERFORMED TODAY.  ACTINIC DAMAGE - Chronic condition, secondary to cumulative UV/sun exposure - diffuse scaly erythematous macules with underlying dyspigmentation - Recommend daily broad spectrum sunscreen SPF 30+ to sun-exposed areas, reapply every 2 hours as needed.  - Staying in the shade or wearing long sleeves, sun glasses (UVA+UVB protection) and wide brim hats (4-inch brim around the entire circumference of the hat) are also recommended for sun protection.  - Call for new or changing lesions.  LENTIGINES,  SEBORRHEIC KERATOSES, HEMANGIOMAS - Benign normal skin lesions - Benign-appearing - Call for any changes  MELANOCYTIC NEVI - Tan-brown and/or pink-flesh-colored symmetric macules and papules - Benign appearing on exam today - Observation - Call clinic for new or changing moles - Recommend daily use of broad spectrum spf 30+ sunscreen to sun-exposed areas.    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  - Call if any new or changing lesions are noted between office visits    History of Melanoma Right temple  - No evidence of recurrence today - No lymphadenopathy - Recommend regular full body skin exams - Recommend daily broad spectrum sunscreen SPF 30+ to sun-exposed areas, reapply every 2 hours as needed.  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 We are now waiting for final margins. If positive, will recommend additional resection. If margins are negative, will plan on STSG once his tendon granulates.   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 They are chronic  with expected duration over 1 year. A portion of actinic keratoses will progress to squamous cell carcinoma of the skin. It is not possible to reliably predict which spots will progress to skin  cancer and so treatment is recommended to prevent development of skin cancer.  Recommend daily broad spectrum sunscreen SPF 30+ to sun-exposed areas, reapply every 2 hours as needed.  Recommend staying in the shade or wearing long sleeves, sun glasses (UVA+UVB protection) and wide brim hats (4-inch brim around the entire circumference of the hat). Call for new or changing lesions. Destruction of lesion - face x 6 (6) Complexity: simple   Destruction method: cryotherapy   Informed consent: discussed and consent obtained   Timeout:  patient name, date of birth, surgical site, and procedure verified Lesion destroyed using liquid nitrogen: Yes   Region frozen until ice ball extended beyond lesion: Yes   Outcome: patient tolerated procedure well with no complications   Post-procedure details: wound care instructions given   NEOPLASM OF UNCERTAIN BEHAVIOR right dorsal hand proximal to little finger Epidermal / dermal shaving  Lesion diameter (cm):  2.1 Informed consent: discussed and consent obtained   Timeout: patient name, date of birth, surgical site, and procedure verified   Procedure prep:  Patient was prepped and draped in usual sterile fashion Prep type:  Isopropyl alcohol Anesthesia: the lesion was anesthetized in a standard fashion   Anesthetic:  1% lidocaine  w/ epinephrine  1-100,000 buffered w/ 8.4% NaHCO3 Instrument used: flexible razor blade   Hemostasis achieved with: pressure, aluminum chloride and electrodesiccation   Outcome: patient tolerated procedure well   Post-procedure details: sterile dressing applied and wound care instructions given   Dressing type: bandage and petrolatum    Destruction of lesion Complexity: extensive   Destruction method: electrodesiccation and curettage   Informed consent: discussed and consent obtained   Timeout:  patient name, date of birth, surgical site, and procedure verified Procedure prep:  Patient was prepped and draped in usual  sterile fashion Prep type:  Isopropyl alcohol Anesthesia: the lesion was anesthetized in a standard fashion   Anesthetic:  1% lidocaine  w/ epinephrine  1-100,000 buffered w/ 8.4% NaHCO3 Curettage performed in three different directions: Yes   Electrodesiccation performed over the curetted area: Yes   Lesion length (cm):  2.1 Lesion width (cm):  2.1 Margin per side (cm):  0.2 Final wound size (cm):  2.5 Hemostasis achieved with:  pressure, aluminum chloride and electrodesiccation Outcome: patient tolerated procedure well with no complications   Post-procedure details: sterile dressing applied and wound care instructions given   Dressing type: bandage and petrolatum   Specimen 1 - Surgical pathology Differential Diagnosis: r/o scc ED&C today   Check Margins: No R/o SCC ED&C done today  SKIN CANCER SCREENING   ACTINIC SKIN DAMAGE   LENTIGO   MELANOCYTIC NEVUS, UNSPECIFIED LOCATION   HISTORY OF BASAL CELL CARCINOMA   HISTORY OF MALIGNANT MELANOMA   SARCOMA (HCC)   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  IRandee Busing, CMA, am acting as scribe for Celine Collard, MD.   Documentation: I have reviewed the above documentation for accuracy and completeness, and I agree with the above.  Celine Collard, MD

## 2024-01-07 NOTE — Patient Instructions (Addendum)
 Electrodesiccation and Curettage ("Scrape and Burn") Wound Care Instructions  Leave the original bandage on for 24 hours if possible.  If the bandage becomes soaked or soiled before that time, it is OK to remove it and examine the wound.  A small amount of post-operative bleeding is normal.  If excessive bleeding occurs, remove the bandage, place gauze over the site and apply continuous pressure (no peeking) over the area for 30 minutes. If this does not work, please call our clinic as soon as possible or page your doctor if it is after hours.   Once a day, cleanse the wound with soap and water. It is fine to shower. If a thick crust develops you may use a Q-tip dipped into dilute hydrogen peroxide (mix 1:1 with water) to dissolve it.  Hydrogen peroxide can slow the healing process, so use it only as needed.    After washing, apply petroleum jelly (Vaseline) or an antibiotic ointment if your doctor prescribed one for you, followed by a bandage.    For best healing, the wound should be covered with a layer of ointment at all times. If you are not able to keep the area covered with a bandage to hold the ointment in place, this may mean re-applying the ointment several times a day.  Continue this wound care until the wound has healed and is no longer open. It may take several weeks for the wound to heal and close.  Itching and mild discomfort is normal during the healing process.  If you have any discomfort, you can take Tylenol (acetaminophen) or ibuprofen as directed on the bottle. (Please do not take these if you have an allergy to them or cannot take them for another reason).  Some redness, tenderness and white or yellow material in the wound is normal healing.  If the area becomes very sore and red, or develops a thick yellow-green material (pus), it may be infected; please notify us .    Wound healing continues for up to one year following surgery. It is not unusual to experience pain in the scar  from time to time during the interval.  If the pain becomes severe or the scar thickens, you should notify the office.    A slight amount of redness in a scar is expected for the first six months.  After six months, the redness will fade and the scar will soften and fade.  The color difference becomes less noticeable with time.  If there are any problems, return for a post-op surgery check at your earliest convenience.  To improve the appearance of the scar, you can use silicone scar gel, cream, or sheets (such as Mederma or Serica) every night for up to one year. These are available over the counter (without a prescription).  Please call our office at [phone] for any questions or concerns.Biopsy Wound Care Instructions  Leave the original bandage on for 24 hours if possible.  If the bandage becomes soaked or soiled before that time, it is OK to remove it and examine the wound.  A small amount of post-operative bleeding is normal.  If excessive bleeding occurs, remove the bandage, place gauze over the site and apply continuous pressure (no peeking) over the area for 30 minutes. If this does not work, please call our clinic as soon as possible or page your doctor if it is after hours.   Once a day, cleanse the wound with soap and water. It is fine to shower. If a thick  crust develops you may use a Q-tip dipped into dilute hydrogen peroxide (mix 1:1 with water) to dissolve it.  Hydrogen peroxide can slow the healing process, so use it only as needed.    After washing, apply petroleum jelly (Vaseline) or an antibiotic ointment if your doctor prescribed one for you, followed by a bandage.    For best healing, the wound should be covered with a layer of ointment at all times. If you are not able to keep the area covered with a bandage to hold the ointment in place, this may mean re-applying the ointment several times a day.  Continue this wound care until the wound has healed and is no longer open.    Itching and mild discomfort is normal during the healing process. However, if you develop pain or severe itching, please call our office.   If you have any discomfort, you can take Tylenol (acetaminophen) or ibuprofen as directed on the bottle. (Please do not take these if you have an allergy to them or cannot take them for another reason).  Some redness, tenderness and white or yellow material in the wound is normal healing.  If the area becomes very sore and red, or develops a thick yellow-green material (pus), it may be infected; please notify us .    If you have stitches, return to clinic as directed to have the stitches removed. You will continue wound care for 2-3 days after the stitches are removed.   Wound healing continues for up to one year following surgery. It is not unusual to experience pain in the scar from time to time during the interval.  If the pain becomes severe or the scar thickens, you should notify the office.    A slight amount of redness in a scar is expected for the first six months.  After six months, the redness will fade and the scar will soften and fade.  The color difference becomes less noticeable with time.  If there are any problems, return for a post-op surgery check at your earliest convenience.  To improve the appearance of the scar, you can use silicone scar gel, cream, or sheets (such as Mederma or Serica) every night for up to one year. These are available over the counter (without a prescription).  Please call our office at [phone] for any questions or concerns.      Actinic keratoses are precancerous spots that appear secondary to cumulative UV radiation exposure/sun exposure over time. They are chronic with expected duration over 1 year. A portion of actinic keratoses will progress to squamous cell carcinoma of the skin. It is not possible to reliably predict which spots will progress to skin cancer and so treatment is recommended to prevent  development of skin cancer.  Recommend daily broad spectrum sunscreen SPF 30+ to sun-exposed areas, reapply every 2 hours as needed.  Recommend staying in the shade or wearing long sleeves, sun glasses (UVA+UVB protection) and wide brim hats (4-inch brim around the entire circumference of the hat). Call for new or changing lesions. Cryotherapy Aftercare  Wash gently with soap and water everyday.   Apply Vaseline and Band-Aid daily until healed.    Melanoma ABCDEs  Melanoma is the most dangerous type of skin cancer, and is the leading cause of death from skin disease.  You are more likely to develop melanoma if you: Have light-colored skin, light-colored eyes, or red or blond hair Spend a lot of time in the sun Tan regularly, either outdoors or  in a tanning bed Have had blistering sunburns, especially during childhood Have a close family member who has had a melanoma Have atypical moles or large birthmarks  Early detection of melanoma is key since treatment is typically straightforward and cure rates are extremely high if we catch it early.   The first sign of melanoma is often a change in a mole or a new dark spot.  The ABCDE system is a way of remembering the signs of melanoma.  A for asymmetry:  The two halves do not match. B for border:  The edges of the growth are irregular. C for color:  A mixture of colors are present instead of an even brown color. D for diameter:  Melanomas are usually (but not always) greater than 6mm - the size of a pencil eraser. E for evolution:  The spot keeps changing in size, shape, and color.  Please check your skin once per month between visits. You can use a small mirror in front and a large mirror behind you to keep an eye on the back side or your body.   If you see any new or changing lesions before your next follow-up, please call to schedule a visit.  Please continue daily skin protection including broad spectrum sunscreen SPF 30+ to  sun-exposed areas, reapplying every 2 hours as needed when you're outdoors.   Staying in the shade or wearing long sleeves, sun glasses (UVA+UVB protection) and wide brim hats (4-inch brim around the entire circumference of the hat) are also recommended for sun protection.    Due to recent changes in healthcare laws, you may see results of your pathology and/or laboratory studies on MyChart before the doctors have had a chance to review them. We understand that in some cases there may be results that are confusing or concerning to you. Please understand that not all results are received at the same time and often the doctors may need to interpret multiple results in order to provide you with the best plan of care or course of treatment. Therefore, we ask that you please give us  2 business days to thoroughly review all your results before contacting the office for clarification. Should we see a critical lab result, you will be contacted sooner.   If You Need Anything After Your Visit  If you have any questions or concerns for your doctor, please call our main line at [phone] and press option 4 to reach your doctor's medical assistant. If no one answers, please leave a voicemail as directed and we will return your call as soon as possible. Messages left after 4 pm will be answered the following business day.   You may also send us  a message via MyChart. We typically respond to MyChart messages within 1-2 business days.  For prescription refills, please ask your pharmacy to contact our office. Our fax number is [phone].  If you have an urgent issue when the clinic is closed that cannot wait until the next business day, you can page your doctor at the number below.    Please note that while we do our best to be available for urgent issues outside of office hours, we are not available 24/7.   If you have an urgent issue and are unable to reach us , you may choose to seek medical care at your  doctor's office, retail clinic, urgent care center, or emergency room.  If you have a medical emergency, please immediately call 911 or go to the emergency department.  Pager Numbers  - Dr. Bary Likes: [phone]  - Dr. Annette Barters: [phone]  - Dr. Felipe Horton: [phone]   In the event of inclement weather, please call our main line at [phone] for an update on the status of any delays or closures.  Dermatology Medication Tips: Please keep the boxes that topical medications come in in order to help keep track of the instructions about where and how to use these. Pharmacies typically print the medication instructions only on the boxes and not directly on the medication tubes.   If your medication is too expensive, please contact our office at [phone] option 4 or send us  a message through MyChart.   We are unable to tell what your co-pay for medications will be in advance as this is different depending on your insurance coverage. However, we may be able to find a substitute medication at lower cost or fill out paperwork to get insurance to cover a needed medication.   If a prior authorization is required to get your medication covered by your insurance company, please allow us  1-2 business days to complete this process.  Drug prices often vary depending on where the prescription is filled and some pharmacies may offer cheaper prices.  The website www.goodrx.com contains coupons for medications through different pharmacies. The prices here do not account for what the cost may be with help from insurance (it may be cheaper with your insurance), but the website can give you the price if you did not use any insurance.  - You can print the associated coupon and take it with your prescription to the pharmacy.  - You may also stop by our office during regular business hours and pick up a GoodRx coupon card.  - If you need your prescription sent electronically to a different pharmacy, notify  our office through [facility] or by phone at [phone] option 4.     Si Usted Necesita Algo Despus de Su Visita  Tambin puede enviarnos un mensaje a travs de Clinical cytogeneticist. Por lo general respondemos a los mensajes de MyChart en el transcurso de 1 a 2 das hbiles.  Para renovar recetas, por favor pida a su farmacia que se ponga en contacto con nuestra oficina. Franz Jacks de fax es [place] [phone].  Si tiene un asunto urgente cuando la clnica est cerrada y que no puede esperar hasta el siguiente da hbil, puede llamar/localizar a su doctor(a) al nmero que aparece a continuacin.   Por favor, tenga en cuenta que aunque hacemos todo lo posible para estar disponibles para asuntos urgentes fuera del horario de [place], no estamos disponibles las 24 horas del da, los 7 [address]  [address] de la [place].   Si tiene un problema urgente y no puede comunicarse con nosotros, puede optar por buscar atencin mdica  en el consultorio de su doctor(a), en una clnica privada, en un centro de atencin urgente o en una sala de emergencias.  Si tiene Engineer, drilling, por favor llame inmediatamente al 911 o vaya a la sala de emergencias.  Nmeros de bper  - Dr. Bary Likes: [phone]  - Dra. Annette Barters: [phone]  - Dr. Felipe Horton: [phone]   En caso de inclemencias del tiempo, por favor llame a Lajuan Pila principal al [phone] para una actualizacin sobre el [place] de cualquier retraso o cierre.  Consejos para la medicacin en dermatologa: Por favor, guarde las cajas en las que vienen los medicamentos de uso tpico para ayudarle a seguir las instrucciones sobre dnde y cmo usarlos. Las farmacias generalmente  imprimen las instrucciones del medicamento slo en las cajas y no directamente en los tubos del [place].   Si su medicamento es muy caro, por favor, pngase en contacto con Bettyjane Brunet llamando al [phone] y presione la opcin 4 o envenos un mensaje a travs de  Clinical cytogeneticist.   No podemos decirle cul ser su copago por los medicamentos por adelantado ya que esto es diferente dependiendo de la cobertura de su seguro. Sin embargo, es posible que podamos encontrar un medicamento sustituto a Audiological scientist un formulario para que el seguro cubra el medicamento que se considera necesario.   Si se requiere una autorizacin previa para que su compaa de seguros [place] su medicamento, por favor permtanos de 1 a 2 das hbiles para completar este proceso.  Los precios de los medicamentos varan con frecuencia dependiendo del Environmental consultant de dnde se surte la receta y alguna farmacias pueden ofrecer precios ms baratos.  El sitio web www.goodrx.com tiene cupones para medicamentos de Health and safety inspector. Los precios aqu no tienen en cuenta lo que podra costar con la ayuda del seguro (puede ser ms barato con su seguro), pero el sitio web puede darle el precio si no utiliz Tourist information centre manager.  - Puede imprimir el cupn correspondiente y llevarlo con su receta a la farmacia.  - Tambin puede pasar por nuestra oficina durante el horario de atencin regular y Education officer, museum una tarjeta de cupones de GoodRx.  - Si necesita que su receta se enve electrnicamente a una farmacia diferente, informe a nuestra oficina a travs de MyChart de [place] o por telfono llamando al [phone] y presione la opcin 4.

## 2024-01-08 ENCOUNTER — Ambulatory Visit: Payer: Medicare Other | Admitting: Dermatology

## 2024-01-11 LAB — SURGICAL PATHOLOGY

## 2024-01-15 ENCOUNTER — Ambulatory Visit: Payer: Self-pay

## 2024-01-15 NOTE — Telephone Encounter (Signed)
 Patient informed of pathology results

## 2024-01-15 NOTE — Telephone Encounter (Signed)
-----   Message from Celine Collard sent at 01/14/2024  5:23 PM EDT ----- FINAL DIAGNOSIS        1. 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

## 2024-10-13 ENCOUNTER — Ambulatory Visit: Admitting: Dermatology

## 2025-01-13 ENCOUNTER — Ambulatory Visit: Admitting: Dermatology
